# Patient Record
Sex: Male | Born: 1955 | Race: White | Hispanic: No | Marital: Married | Smoking: Never smoker
Health system: Southern US, Academic
[De-identification: ages and names within clinical notes are randomized; demographics above are authoritative.]

## PROBLEM LIST (undated history)

## (undated) DIAGNOSIS — J309 Allergic rhinitis, unspecified: Secondary | ICD-10-CM

## (undated) DIAGNOSIS — K219 Gastro-esophageal reflux disease without esophagitis: Secondary | ICD-10-CM

## (undated) DIAGNOSIS — Z9989 Dependence on other enabling machines and devices: Secondary | ICD-10-CM

## (undated) DIAGNOSIS — M1990 Unspecified osteoarthritis, unspecified site: Secondary | ICD-10-CM

## (undated) DIAGNOSIS — E785 Hyperlipidemia, unspecified: Secondary | ICD-10-CM

## (undated) DIAGNOSIS — G4730 Sleep apnea, unspecified: Secondary | ICD-10-CM

## (undated) DIAGNOSIS — C801 Malignant (primary) neoplasm, unspecified: Secondary | ICD-10-CM

## (undated) DIAGNOSIS — Z973 Presence of spectacles and contact lenses: Secondary | ICD-10-CM

## (undated) DIAGNOSIS — K5792 Diverticulitis of intestine, part unspecified, without perforation or abscess without bleeding: Secondary | ICD-10-CM

## (undated) DIAGNOSIS — I10 Essential (primary) hypertension: Secondary | ICD-10-CM

## (undated) DIAGNOSIS — H9190 Unspecified hearing loss, unspecified ear: Secondary | ICD-10-CM

## (undated) DIAGNOSIS — Z8669 Personal history of other diseases of the nervous system and sense organs: Secondary | ICD-10-CM

## (undated) HISTORY — PX: COLON SURGERY: SHX602

## (undated) HISTORY — PX: HX HERNIA REPAIR: SHX51

## (undated) HISTORY — PX: HX OTHER: 2100001105

## (undated) HISTORY — DX: Allergic rhinitis, unspecified: J30.9

## (undated) HISTORY — PX: HX BACK SURGERY: SHX140

## (undated) HISTORY — PX: COLONOSCOPY: SHX174

## (undated) HISTORY — DX: Unspecified hearing loss, unspecified ear: H91.90

## (undated) HISTORY — PX: HX HIP REPLACEMENT: SHX124

## (undated) SURGERY — Surgical Case
Anesthesia: *Unknown

---

## 2008-10-04 ENCOUNTER — Other Ambulatory Visit (HOSPITAL_COMMUNITY): Payer: Self-pay | Admitting: OTOLARYNGOLOGY

## 2014-05-23 HISTORY — PX: HX HERNIA REPAIR: SHX51

## 2020-05-23 HISTORY — PX: HX BACK SURGERY: SHX140

## 2020-07-06 IMAGING — CR XRAY LUMBAR SPINE MINIMUM 4 VIEWS
1 series · 5 of 5 positions shown · non-contrast
Comparison: None available.

﻿EXAM:  [id number] - X-RAY EXAM L-S SPINE MINIMUM 4 VIEWS
INDICATION: Chronic lower back pain.

[Series 1: view not recorded · 0.17mm/px · 5 of 5 slices shown]
[im 1/5]
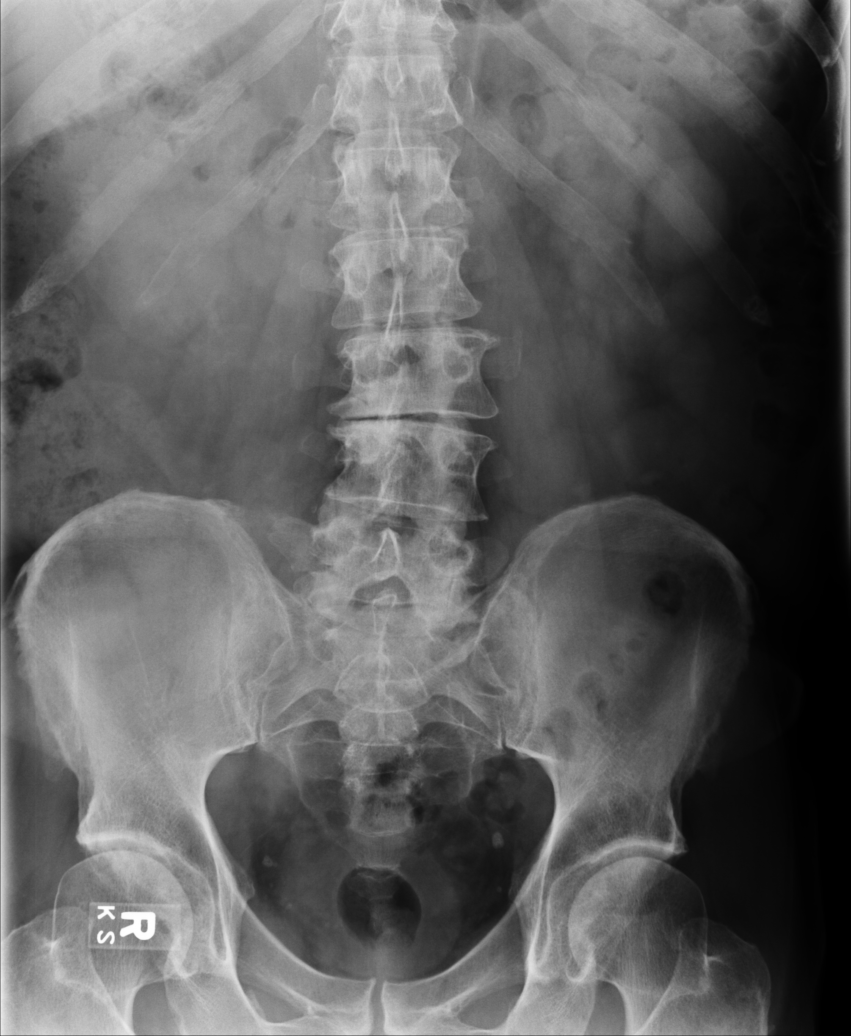
[im 2/5]
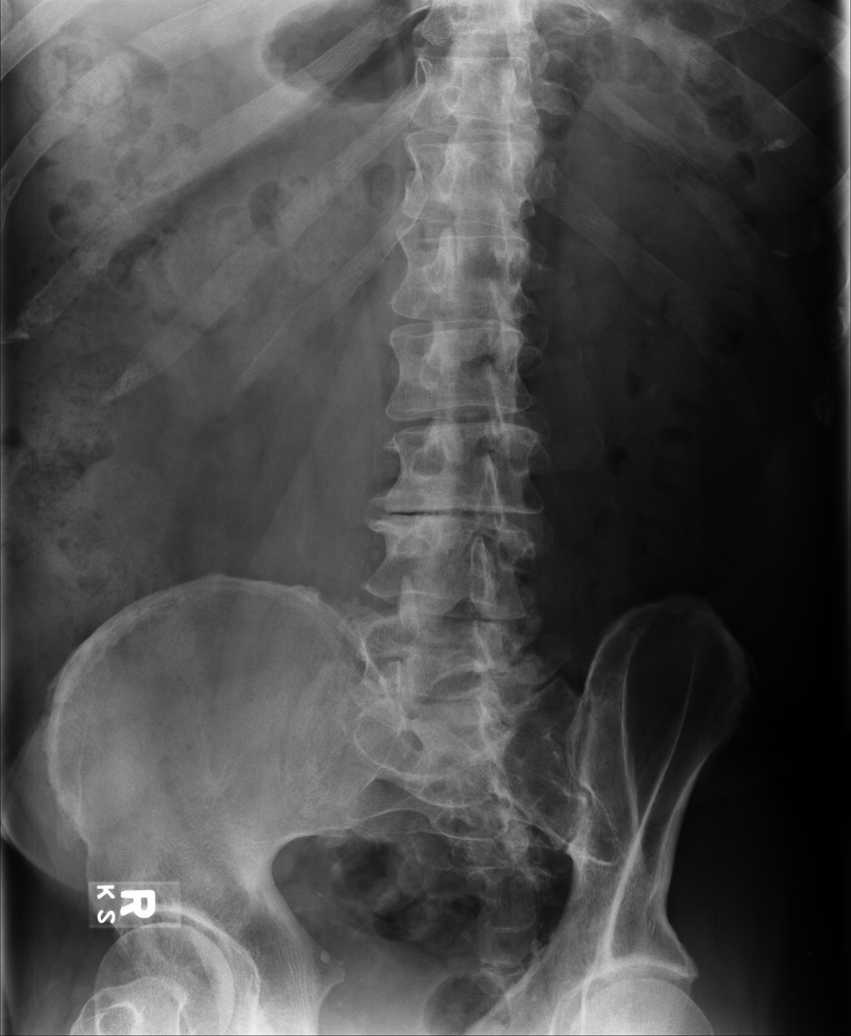
[im 3/5]
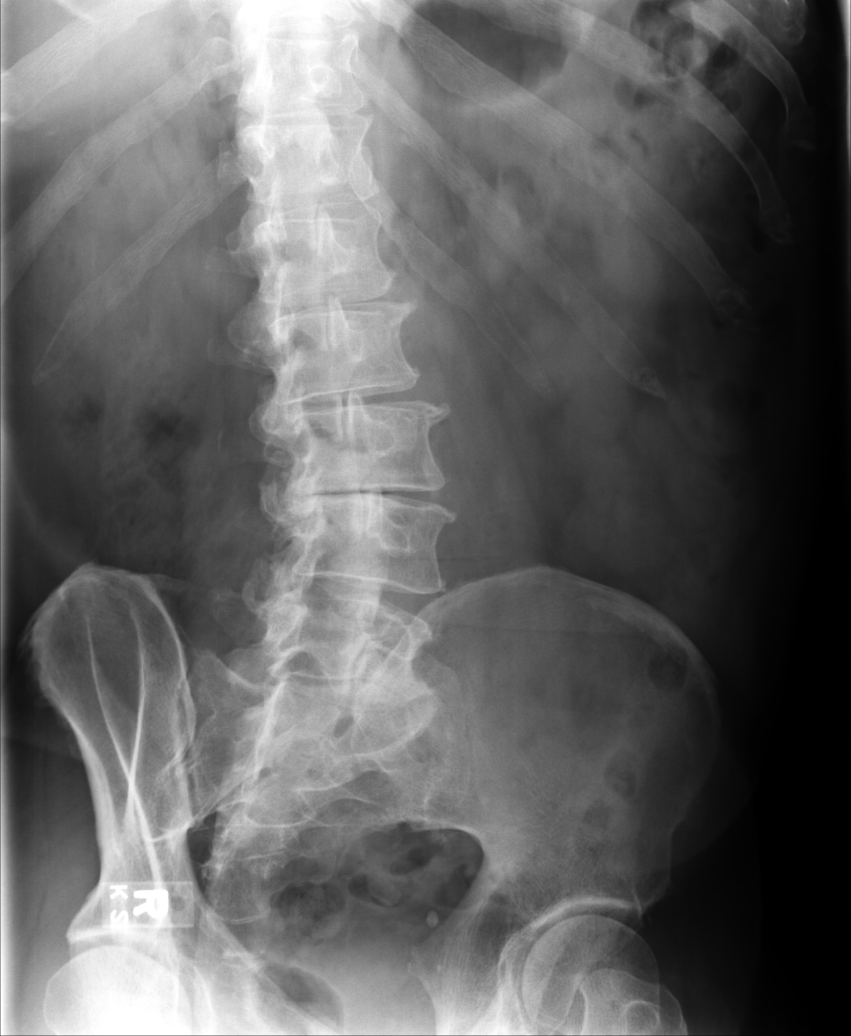
[im 4/5]
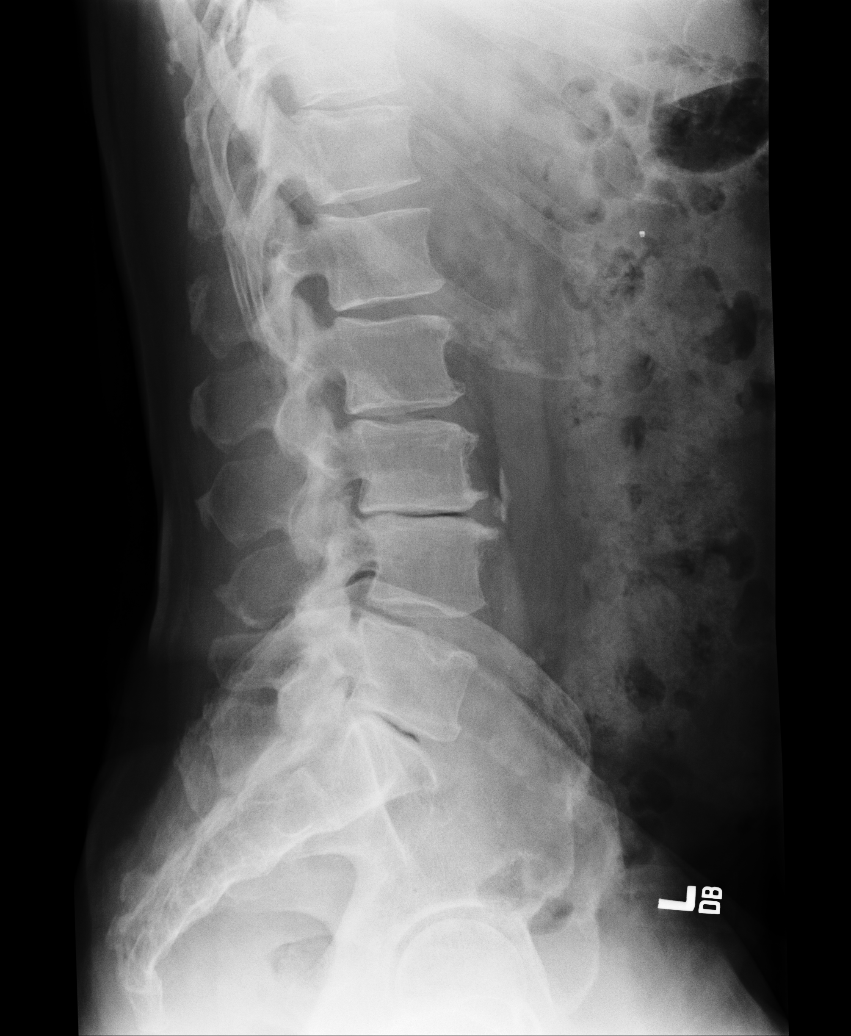
[im 5/5]
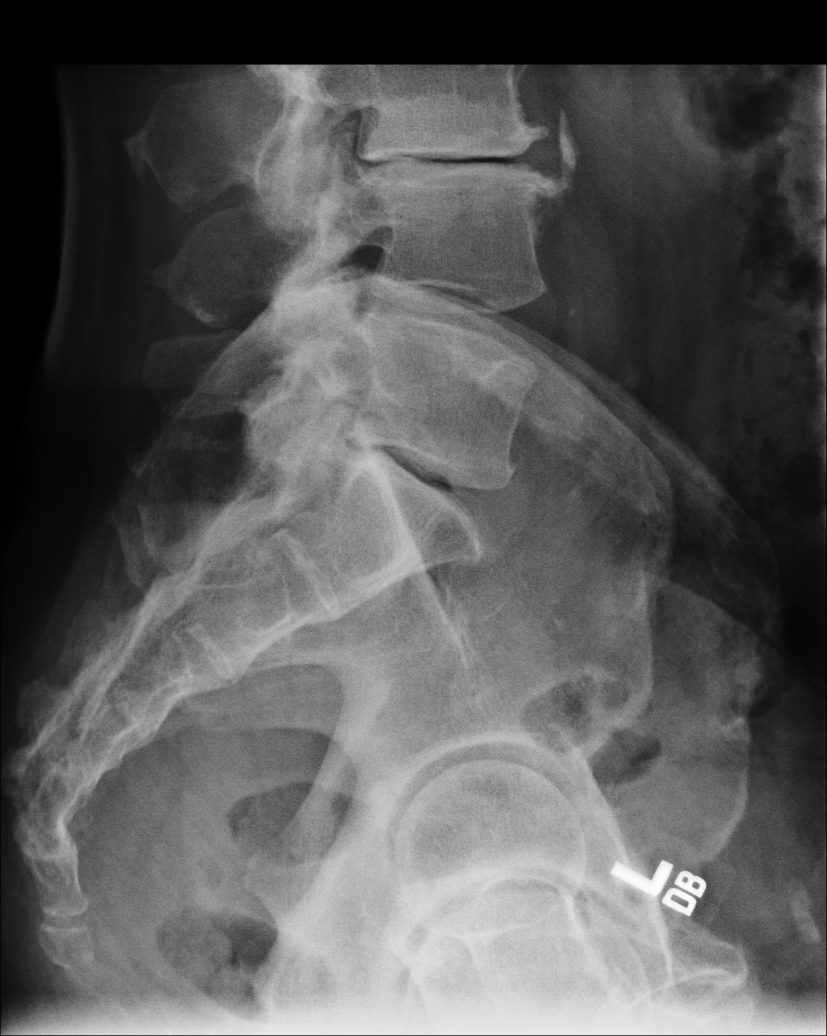

[5 of 5 positions shown; findings below may reference images not displayed]

FINDINGS: Vertebral bodies are normal in height and alignment. There is no acute fracture or subluxation. There is moderate to severe degenerative disc disease at L2-3, L3-4 and L5-S1 levels. There is also moderate facet arthropathy within the lower lumbar spine. Paraspinal soft tissues are unremarkable. There are scattered vascular calcifications.
IMPRESSION: Advanced multilevel arthritic changes as detailed above.

## 2020-07-20 IMAGING — MR MRI LUMBAR SPINE WITHOUT CONTRAST
5 of 7 series · 32 of 48 positions shown · IV contrast (gadolinium)
Comparison: None.

﻿EXAM:  [id number]   MRI LUMBAR SPINE WITHOUT CONTRAST
INDICATION: Worsening low back pain.
TECHNIQUE: Multiplanar, multisequential MRI of the lumbar spine was performed without gadolinium contrast.

[Series 5: T2 · sagittal · 4.0mm · 1.01mm/px · 5 of 13 slices shown (1 of 3)]
[im 1/13]
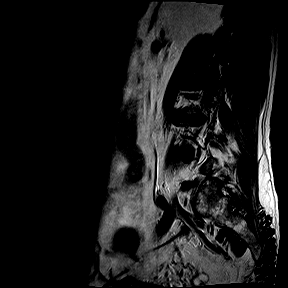
[im 4/13]
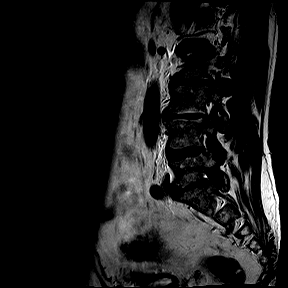
[im 7/13]
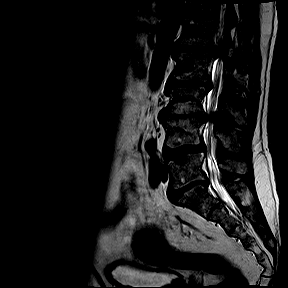
[im 10/13]
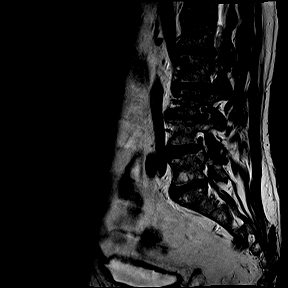
[im 13/13]
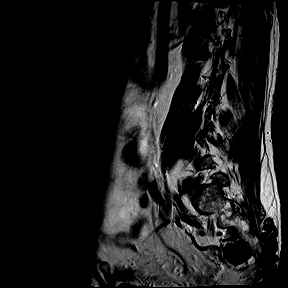

[Series 6: T1 · sagittal · 4.0mm · 1.01mm/px · 5 of 13 slices shown (1 of 2)]
[im 1/13]
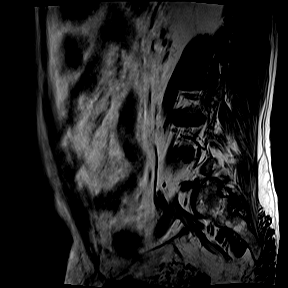
[im 4/13]
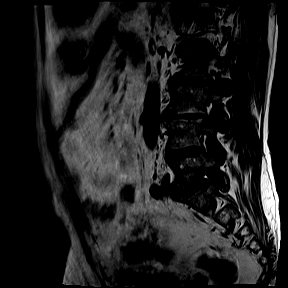
[im 7/13]
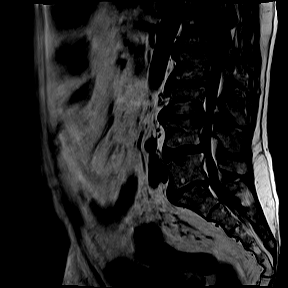
[im 10/13]
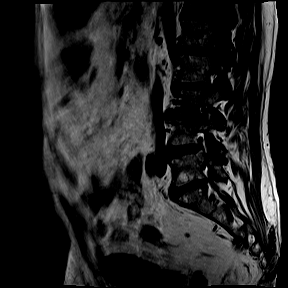
[im 13/13]
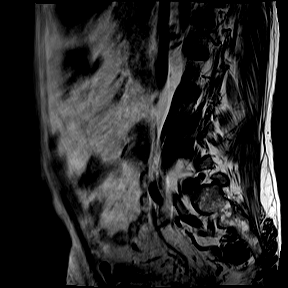

[Series 9: T2 · axial · 4.0mm · 0.52mm/px · z∈[-91,+109]mm · 9 of 23 slices shown (2 of 3)]
[im 1/23]
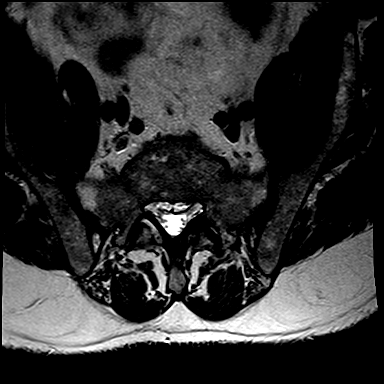
[im 3/23]
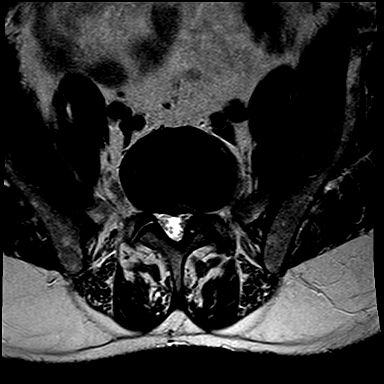
[im 6/23]
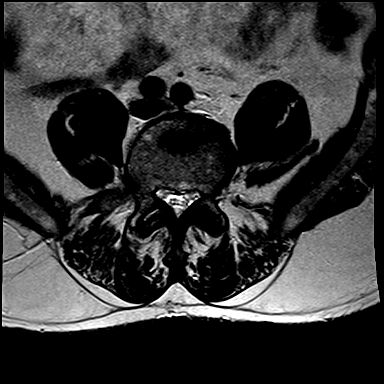
[im 9/23]
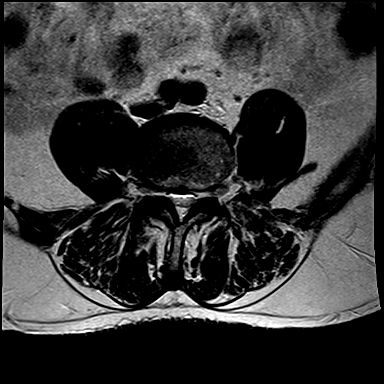
[im 12/23]
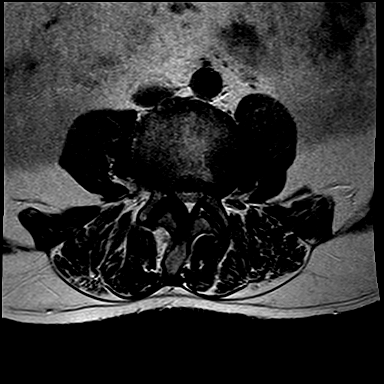
[im 14/23]
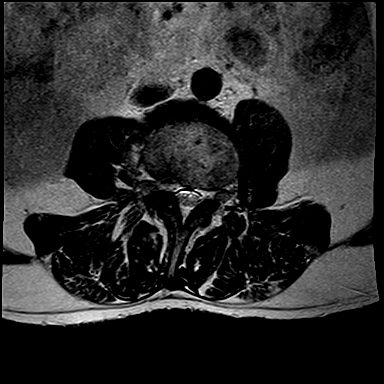
[im 17/23]
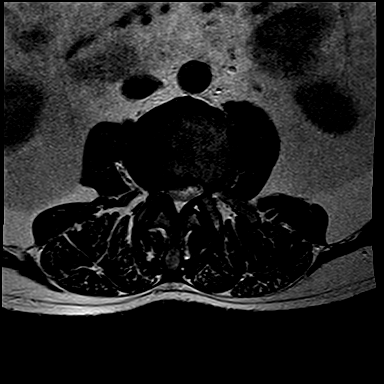
[im 20/23]
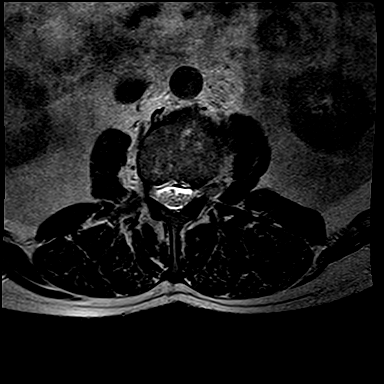
[im 23/23]
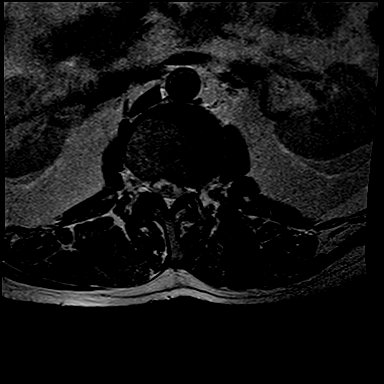

[Series 10: T1 · axial · 4.0mm · 0.52mm/px · z∈[-91,+62]mm · 6 of 23 slices shown (2 of 2)]
[im 1/23]
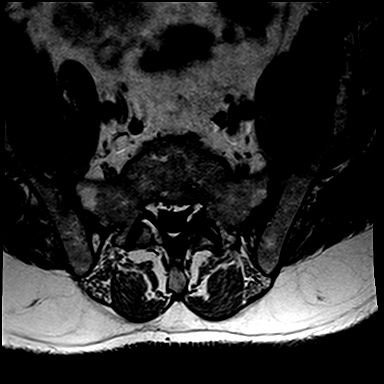
[im 3/23]
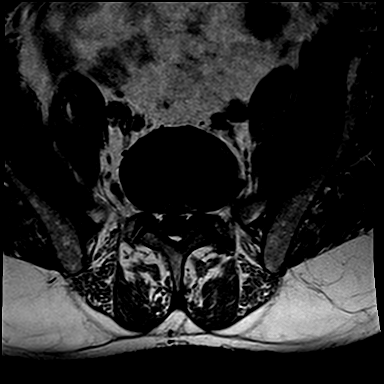
[im 6/23]
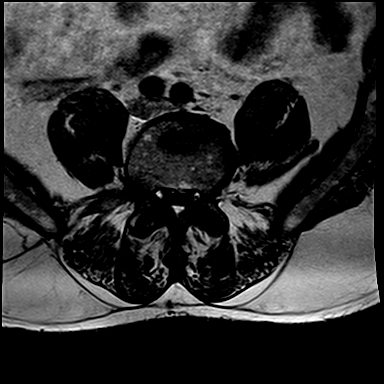
[im 9/23]
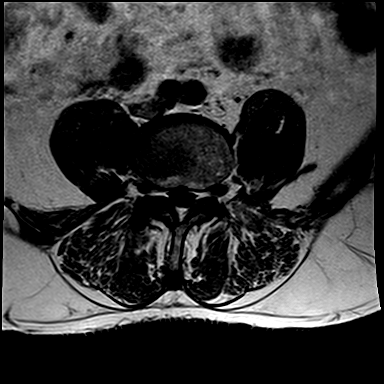
[im 14/23]
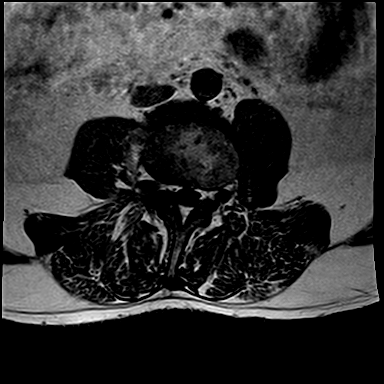
[im 17/23]
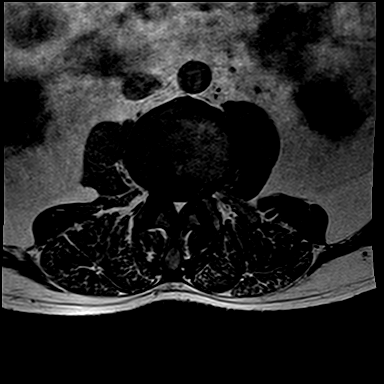

[Series 14: T2 · coronal · 5.0mm · 0.82mm/px · 7 of 18 slices shown (3 of 3)]
[im 1/18]
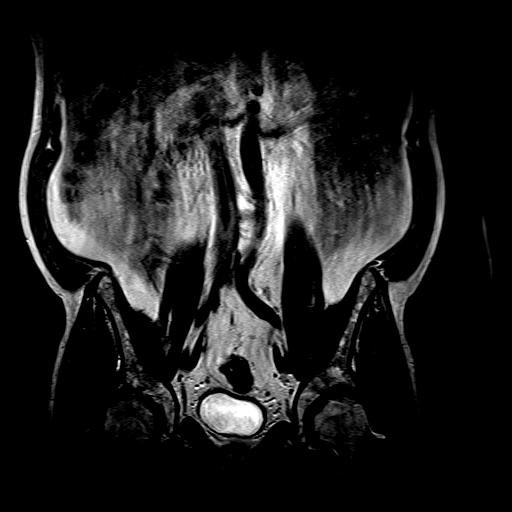
[im 3/18]
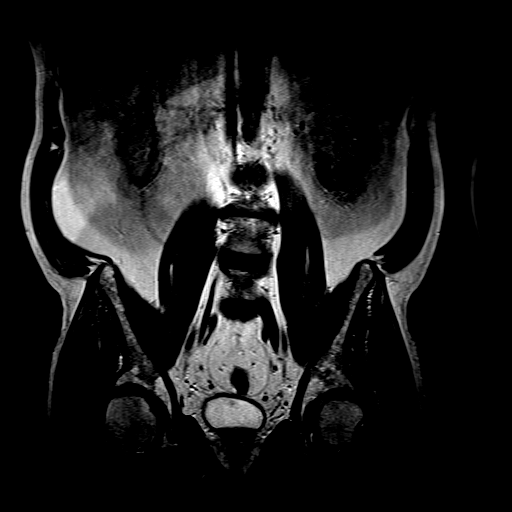
[im 6/18]
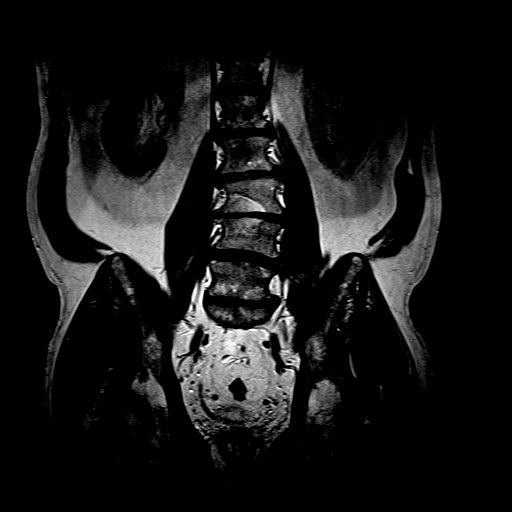
[im 9/18]
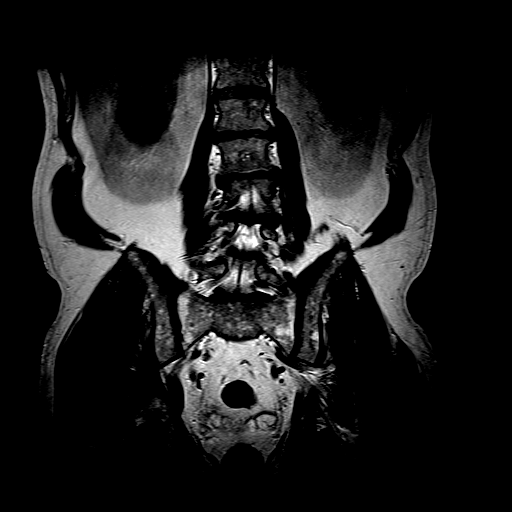
[im 12/18]
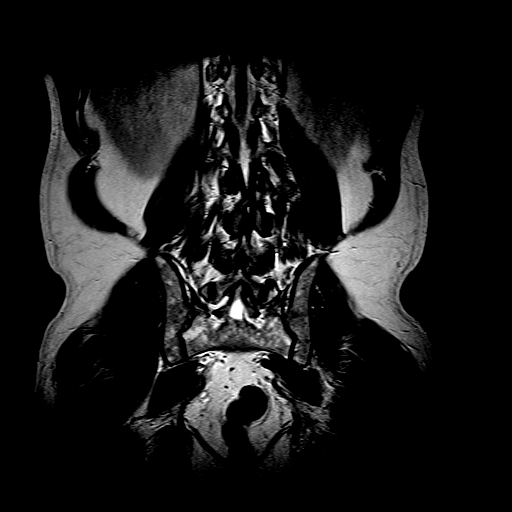
[im 15/18]
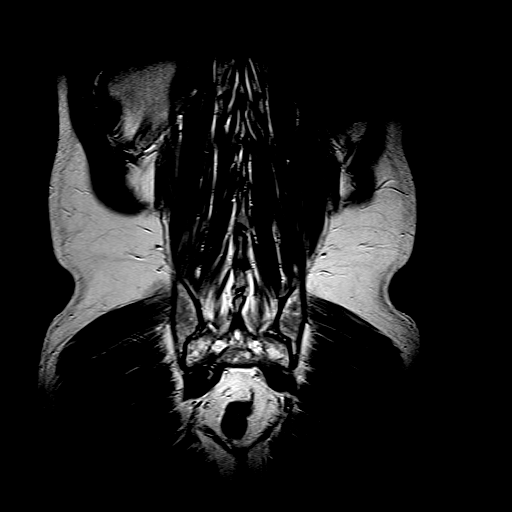
[im 18/18]
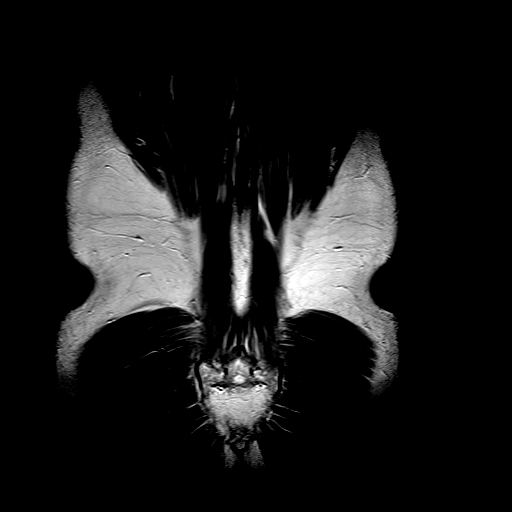

[32 of 48 positions shown; findings below may reference images not displayed]

FINDINGS: There is no acute bone marrow edema.  Conus terminates at L1-L2.  

At L1-L2, there is a significant left paracentral disc protrusion measuring 11 mm along with broad-based disc bulge, causing severe compromise of the central canal and left lateral recess.  

At L2-L3, broad-based disc bulge noted causing severe bilateral foraminal stenosis and moderate central canal narrowing.  

At L3-L4, broad-based disc osteophyte complex causing severe bilateral foraminal stenosis and moderate to severe central canal narrowing.  

At L4-L5, broad-based disc bulge causing severe bilateral foraminal and central canal stenosis.  

At L5-S1, disc osteophyte complex causing moderate bilateral foraminal narrowing.
IMPRESSION: 1. No acute bone marrow edema, compression fractures, or subluxation. 

2. Significant left paracentral sequestered disc fragment L1-L2 causing severe compromise at the left lateral recess and central canal.

3. Severe bilateral foraminal and central canal stenosis at L2-L3, L3-L4, and L4-L5 secondary to broad-based disc bulges.  

4. Moderate to severe bilateral foraminal stenosis at L5-S1.

## 2020-07-21 HISTORY — PX: HX HIP REPLACEMENT: SHX124

## 2021-02-10 IMAGING — MR MRI LUMBAR SPINE WITHOUT AND WITH CONTRAST
9 series · 48 of 48 positions shown · IV contrast (gadavist)
Comparison: MRI dated 07/20/2020 and radiographs dated 07/06/2020.

﻿EXAM:  [id number]   MRI LUMBAR SPINE WITHOUT AND WITH CONTRAST
INDICATION: New onset lower back pain. Surgery few months ago.
TECHNIQUE: Multiplanar multisequential MRI of the lumbar spine was performed without and with 5 mL of Gadavist.

[Series 5: T2 · sagittal · 4.0mm · 1.01mm/px · 3 of 13 slices shown (1 of 3)]
[im 1/13]
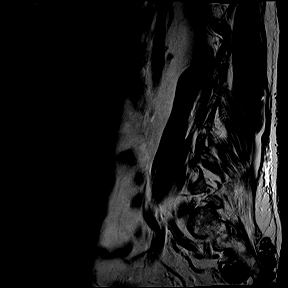
[im 7/13]
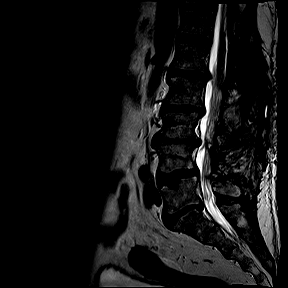
[im 13/13]
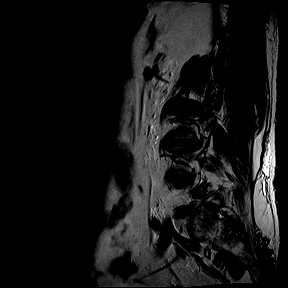

[Series 6: T1 · sagittal · 4.0mm · 1.01mm/px · 3 of 13 slices shown]
[im 1/13]
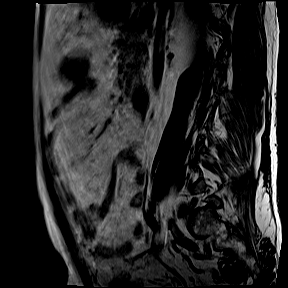
[im 7/13]
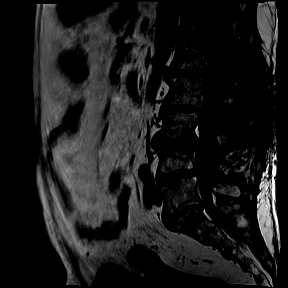
[im 13/13]
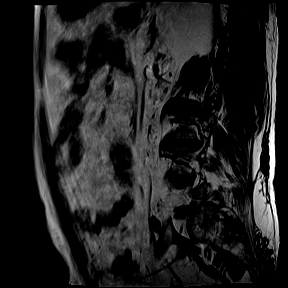

[Series 8: T1 fat-sat · sagittal · 4.0mm · 1.13mm/px · 4 of 13 slices shown (1 of 2)]
[im 1/13]
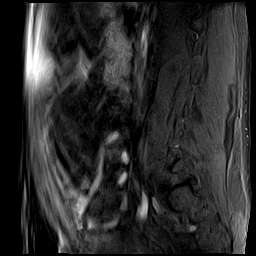
[im 5/13]
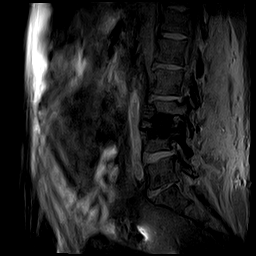
[im 9/13]
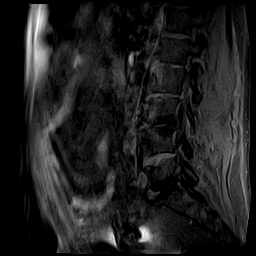
[im 13/13]
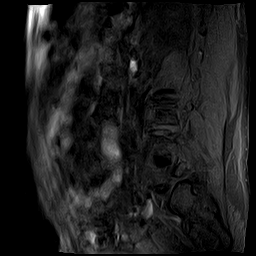

[Series 9: STIR · sagittal · 4.0mm · 1.13mm/px · 4 of 13 slices shown]
[im 1/13]
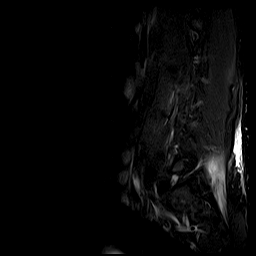
[im 5/13]
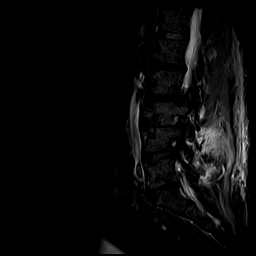
[im 9/13]
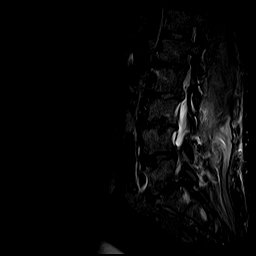
[im 13/13]
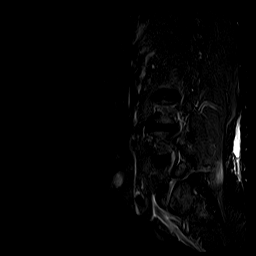

[Series 10: T2 · axial · 4.0mm · 0.47mm/px · z∈[-107,+86]mm · 8 of 25 slices shown (2 of 3)]
[im 1/25]
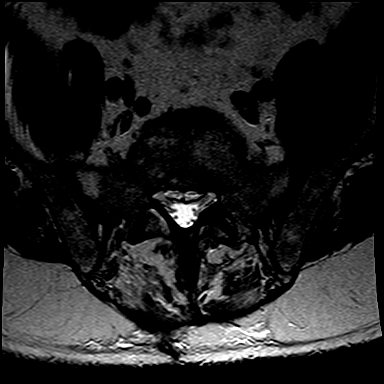
[im 4/25]
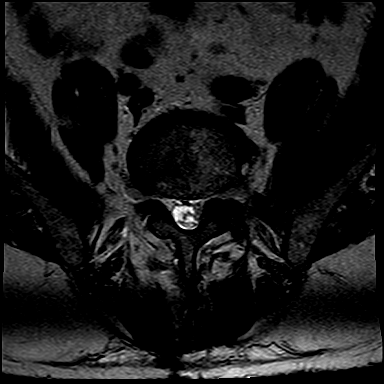
[im 7/25]
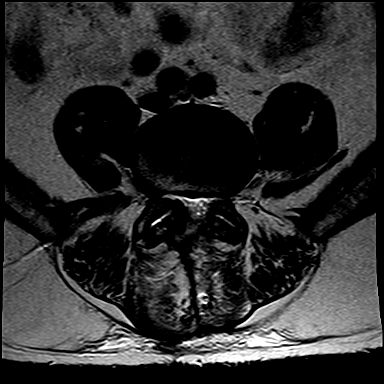
[im 11/25]
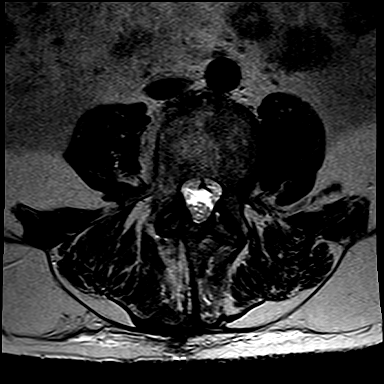
[im 14/25]
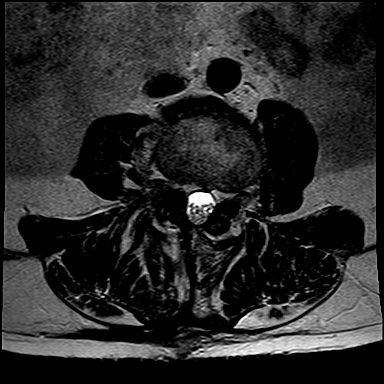
[im 18/25]
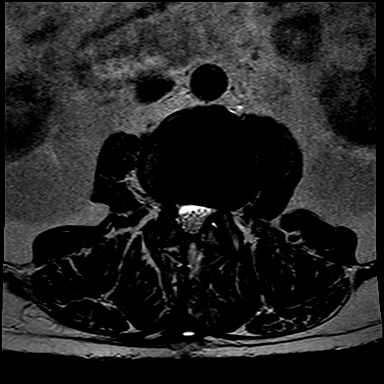
[im 21/25]
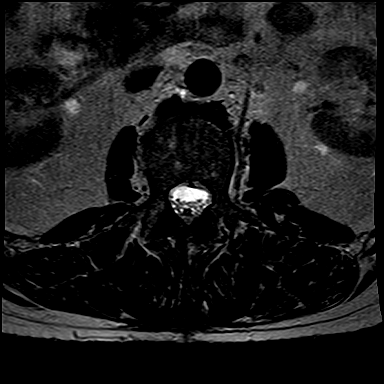
[im 25/25]
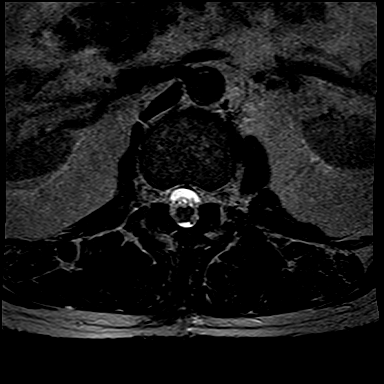

[Series 11: T1 fat-sat · axial · 4.0mm · 0.70mm/px · z∈[-107,+86]mm · 8 of 25 slices shown (2 of 2)]
[im 1/25]
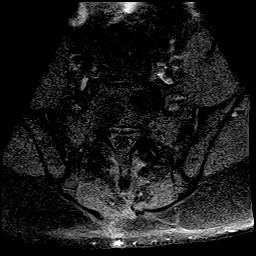
[im 4/25]
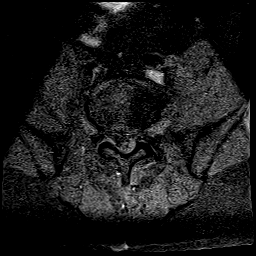
[im 7/25]
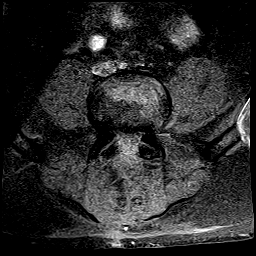
[im 11/25]
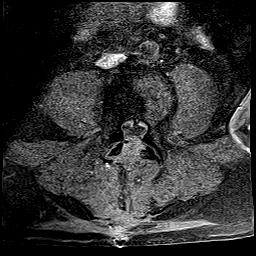
[im 14/25]
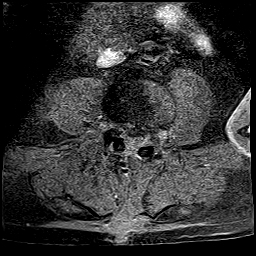
[im 18/25]
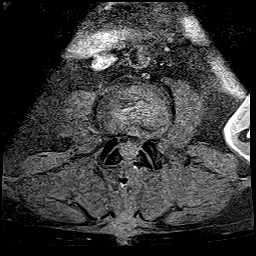
[im 21/25]
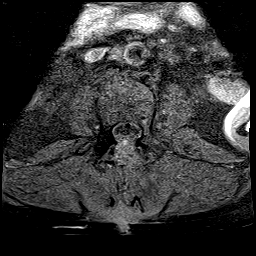
[im 25/25]
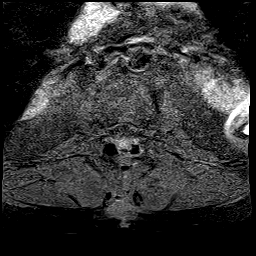

[Series 12: T1 fat-sat post-contrast · sagittal · 4.0mm · 1.13mm/px · 4 of 13 slices shown (1 of 2)]
[im 1/13]
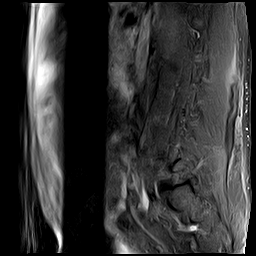
[im 5/13]
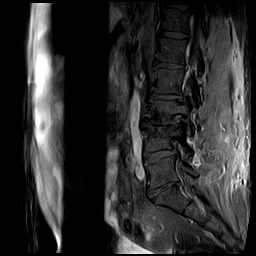
[im 9/13]
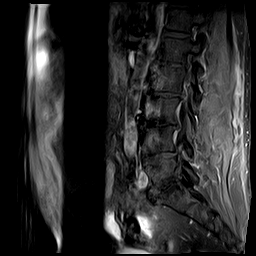
[im 13/13]
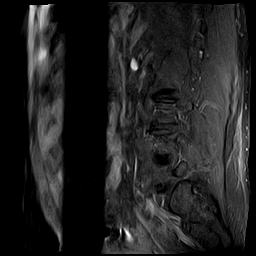

[Series 13: T1 fat-sat post-contrast · axial · 4.0mm · 0.70mm/px · z∈[-107,+86]mm · 8 of 25 slices shown (2 of 2)]
[im 1/25]
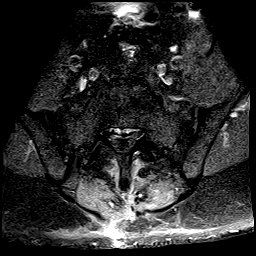
[im 4/25]
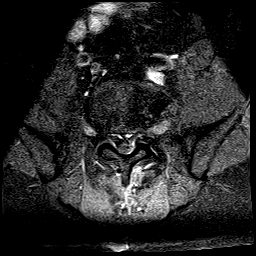
[im 7/25]
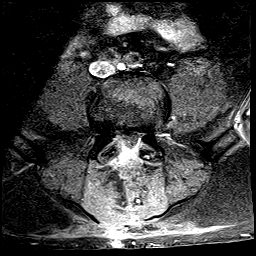
[im 11/25]
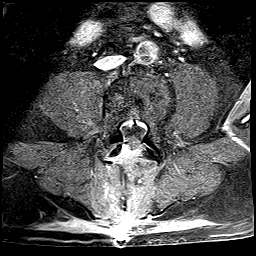
[im 14/25]
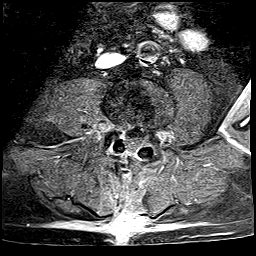
[im 18/25]
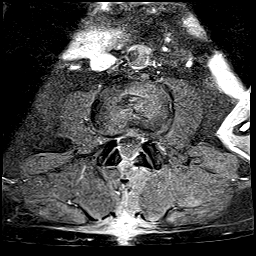
[im 21/25]
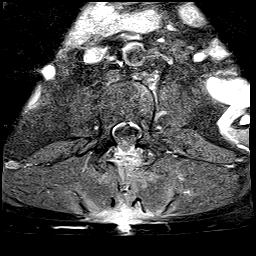
[im 25/25]
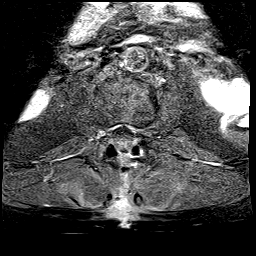

[Series 14: T2 · coronal · 6.0mm · 1.22mm/px · 6 of 20 slices shown (3 of 3)]
[im 1/20]
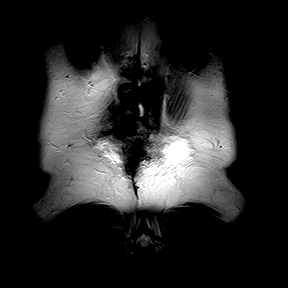
[im 4/20]
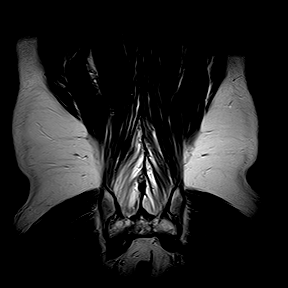
[im 8/20]
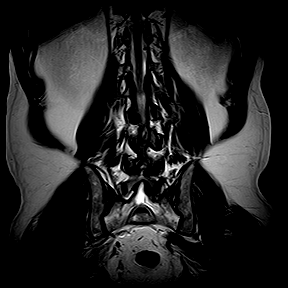
[im 12/20]
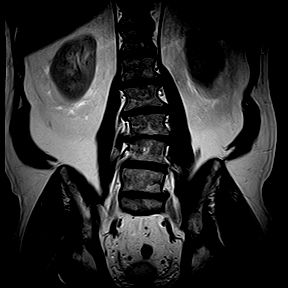
[im 16/20]
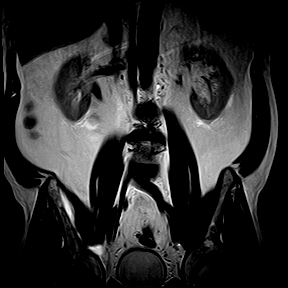
[im 20/20]
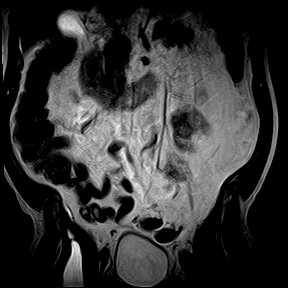

[48 of 48 positions shown; findings below may reference images not displayed]

FINDINGS: Bone marrow signal intensity is normal. There is no acute fracture or subluxation. Distal spinal cord is normal in signal intensity and terminates normally at L1-2 disc space level. Spinal canal is congenitally narrow.

At L1-2 level, there is a small to moderate broad-based central and left paracentral disc bulge mildly effacing the ventral thecal sac. There is a laminectomy defect at this level. There is mild bilateral neural foraminal stenosis from facet arthropathy and bulging annulus.

At L2-3 level, there is minimal retrolisthesis of L2 on L3 vertebral body. There is a small broad-based central disc bulge, mildly effacing the ventral thecal sac. A laminectomy defect is also seen at this level. There is moderate bilateral neural foraminal stenosis from facet arthropathy and bulging annulus.

At L3-4 level, there is marked disc desiccation. There is a small broad-based central disc bulge, mildly effacing the ventral thecal sac. A laminectomy defect is also seen at this level. There is moderate to severe right and mild-to-moderate left neural foraminal stenosis from facet arthropathy and bulging annulus.

At L4-5 level, there is a small broad-based central disc bulge, mildly effacing the ventral thecal sac. A laminectomy defect is also seen at this level. There is moderate bilateral neural foraminal stenosis from facet arthropathy.

At L5-S1 level, there is a small broad-based central disc bulge, minimally abutting the ventral thecal sac. There is moderate to severe left and moderate right neural foraminal stenosis from facet arthropathy and bulging annulus.

Paraspinal soft tissues are unremarkable. Following intravenous contrast administration, there is no abnormal nerve root or paraspinal soft tissue enhancement.
IMPRESSION: 1. L1 through L4 laminectomy defects. 

2. Small disc bulges at all levels without significant spinal stenosis at any level. 

3. Multilevel neural foraminal stenosis as detailed above.

## 2021-03-16 NOTE — Progress Notes (Signed)
[facility]  [address]  [place], [place] [address]  Phone: [phone]  Fax: [phone]    PATIENT NAME: Travis Brooks   DATE:  03/16/2021  DOB: Feb 24, 1956     PCP: No primary care provider on file.    CHIEF COMPLAINT:  The patient is seen at the request of Dr. Marella Chimes for Lumbar pain    HPI:     Today I had the pleasure of seeing Travis Brooks at [facility].  Thank you for referring this pleasant patient.  The history is based upon the patient's recollection of events.  As you know, the patient is a 65 y.o.  male presenting with low back  pain. Pain started 2 months ago, with a gradual onset and since then symptoms have been getting worse.      The patients pain is located in the low back and does radiate down into his right hip, he reports the majority of pain is in his anterior hip and thigh, it radiates down his leg into the top of his foot at times..  The pain is described as aching, burning, throbbing, cold, gnawing, nagging, penetrating, sharp, pins/needles, shooting, sore, stabbing, tightness, stinging. There is associated tingling. There is weakness reported. The symptoms are worse with walking, sitting, lying down and some relief with ice pack, heating pad, Tramadol, Meloxicam, Tylenol, he reports it knocks some of the pain out but it does not take it completly away. There is no bowel/bladder incontinence. They have tried PT and back surgery.       Physical Therapy: [facility] after surgery, it helped to some degree, he continues those exercises   at home now on a daily bases.      Pain Intervential Procedure:  none    Prior Pain Clinic: No    PAST MEDICAL HISTORY:  Past Medical History:   Diagnosis Date   . Anxiety state    . Constipation    . Essential hypertension    . Polyarticular juvenile rheumatoid arthritis, chronic (HCC)    . Sleep apnea           PAST SURGICAL HISTORY:No past surgical history on file.   Back Surgery-  Discectomy At L1/L2 level April of 2022, yes, all of this started afterwords    CURRENT MEDICATIONS:   Current Outpatient Medications   Medication Sig Dispense Refill   . amLODIPine (NORVASC) 5 mg Tablet 1 tablet every day .     . magnesium oxide 200 mg magnesium Tablet 2 tablets .     . metoprolol succinate (TOPROL-XL) 50 mg Tablet Sustained Release 24HR 1 tablet every day .     . potassium chloride SA (K-DUR) 20 mEq Tab Sust.Rel. Particle/Crystal TAKE 1 TABLET ONCE A DAY  WITH FOOD     . aspirin EC 81 mg Tablet, Delayed Release (E.C.) take 81 mg every day by mouth .       No current facility-administered medications for this visit.          ALLERGIES: No Known Allergies       SOCIAL HISTORY:  Social History     Socioeconomic History   . Marital status: Married     Spouse name: Not on file   . Number of children: Not on file   . Years of education: Not on file   . Highest education level: Not on file   Occupational History   . Not on file   Tobacco  seen at this level. There is moderate bilateral neural foraminal stenosis from facet arthropathy.  Al L5-51 level, there is a small broad-based central disc bulge, minimally abutting the ventral thecal sac. There is moderate to severe left and moderate right neural foraminal stenosis from facet arthropathy and bulging annulus.  Paraspinal soft tissues are unremarkable. Following intravenous contrast administration, there is no abnormal nerve root or paraspinal soft tissue enhancement.     ASSESSMENT:  Post laminectomy Pain syndrome  Lumbar radiculopathy  Lumbar spinal stenosis    RECOMMENDATIONS/DECISION MAKING: Kolyn has a lumbar radiculopathy after having surgery earlier this year. His pain is along the L3 deramatome --- Will proceed with a right L3 TFESI as a dx and tx injection. F/u with Dr. Marella Chimes post procedure.    Tobacco abuse: nonsmoker    Lucila Maine, MD    CC:  PCP: No primary care provider on file.               Referring: No ref. provider found     PATIENT NAME:  Travis Brooks  DATE:  03/16/2021  DOB/AGE:   [birth year]  PREOP DX:  Lumbar radiculopathy with degenerative disc disease.  POSTOP DX:  Same   PROCEDURE: Right L3 Transforaminal Epidural Steroid Injection  SURGEON:  Lucila Maine, MD      Anesthesia: Local infiltration with 1% Lidocaine.     Indications: Today I had the pleasure of seeing  Travis Brooks at our pain clinic. The patient denies a current infection and use of blood thinners. The patient denies any new motor or sensory deficits.      I explained the procedure to the patient including the risks, benefits, and alternatives to the procedure.  The patient verbalized understanding and was willing to proceed.    Procedure in detail:   An informed consent was obtained.  The patient was taken to the procedure room and positioned prone on the procedure table.  Vital signs were monitored with an automatic blood pressure cuff, pulse ox, and they remained stable throughout the entire procedure.  The skin was prepped and draped in the standard sterile fashion with Betadine x 3.  Skin and subcutaneous tissues were infiltrated with 1% Lidocaine using a 27 gauge 1 1/4 inch needle.  A fluoroscopic view of the neuroforamin was obtained by positioning the image intensifier in the oblique slight cephalad view. A 22g 5 inch spinal needle with a curved tip was advanced under fluoroscopic guidance under the pedicle.   The needle's position was verified in both AP and Lateral views. Additionally, the position of the needle was verified by injecting radiopaque dye, Iohexol 180 mg/ml, which showed excellent spread of dye along the nerve root and some spread into the epidural space. After negative aspiration for heme or CSF, 40mg  preservative free Triamcinolone diluted in 1 ml of preservative free 1% Lidocaine was injected onto the nerve root.  The needle was withdrawn.  The patient tolerated the procedure well with no complications.     Note:  The procedure was performed under fluoroscopic guidance AP and lateral views were obtained and evaluated.    Pre-procedure Pain: 7/10  Post-procedure Pain: 3/10  [facility]  [address]  [place], [place] [address]  Phone: [phone]  Fax: [phone]    PATIENT NAME: Travis Brooks   DATE:  03/16/2021  DOB: Feb 24, 1956     PCP: No primary care provider on file.    CHIEF COMPLAINT:  The patient is seen at the request of Dr. Marella Chimes for Lumbar pain    HPI:     Today I had the pleasure of seeing Travis Brooks at [facility].  Thank you for referring this pleasant patient.  The history is based upon the patient's recollection of events.  As you know, the patient is a 65 y.o.  male presenting with low back  pain. Pain started 2 months ago, with a gradual onset and since then symptoms have been getting worse.      The patients pain is located in the low back and does radiate down into his right hip, he reports the majority of pain is in his anterior hip and thigh, it radiates down his leg into the top of his foot at times..  The pain is described as aching, burning, throbbing, cold, gnawing, nagging, penetrating, sharp, pins/needles, shooting, sore, stabbing, tightness, stinging. There is associated tingling. There is weakness reported. The symptoms are worse with walking, sitting, lying down and some relief with ice pack, heating pad, Tramadol, Meloxicam, Tylenol, he reports it knocks some of the pain out but it does not take it completly away. There is no bowel/bladder incontinence. They have tried PT and back surgery.       Physical Therapy: [facility] after surgery, it helped to some degree, he continues those exercises   at home now on a daily bases.      Pain Intervential Procedure:  none    Prior Pain Clinic: No    PAST MEDICAL HISTORY:  Past Medical History:   Diagnosis Date   . Anxiety state    . Constipation    . Essential hypertension    . Polyarticular juvenile rheumatoid arthritis, chronic (HCC)    . Sleep apnea           PAST SURGICAL HISTORY:No past surgical history on file.   Back Surgery-  Discectomy At L1/L2 level April of 2022, yes, all of this started afterwords    CURRENT MEDICATIONS:   Current Outpatient Medications   Medication Sig Dispense Refill   . amLODIPine (NORVASC) 5 mg Tablet 1 tablet every day .     . magnesium oxide 200 mg magnesium Tablet 2 tablets .     . metoprolol succinate (TOPROL-XL) 50 mg Tablet Sustained Release 24HR 1 tablet every day .     . potassium chloride SA (K-DUR) 20 mEq Tab Sust.Rel. Particle/Crystal TAKE 1 TABLET ONCE A DAY  WITH FOOD     . aspirin EC 81 mg Tablet, Delayed Release (E.C.) take 81 mg every day by mouth .       No current facility-administered medications for this visit.          ALLERGIES: No Known Allergies       SOCIAL HISTORY:  Social History     Socioeconomic History   . Marital status: Married     Spouse name: Not on file   . Number of children: Not on file   . Years of education: Not on file   . Highest education level: Not on file   Occupational History   . Not on file   Tobacco

## 2021-04-26 IMAGING — MR MRI HIP RT W/O CONTRAST
5 of 7 series · 25 of 40 positions shown · non-contrast
Comparison: No prior imaging studies of the right hip are available for comparison.

﻿EXAM:  [id number]   MRI HIP RT W/O CONTRAST
INDICATION: 65-year-old with worsening right hip pain.  No history of trauma.  Previous history of lumbar spine surgery.  No history of malignancy.
TECHNIQUE: Axial, coronal and sagittal images including T1, fat suppressed inversion recovery and T2 sequences.

[Series 10: T1 · axial · right · 6.0mm · 0.78mm/px · z∈[-100,+88]mm · 7 of 30 slices shown (1 of 3)]
[im 1/30]
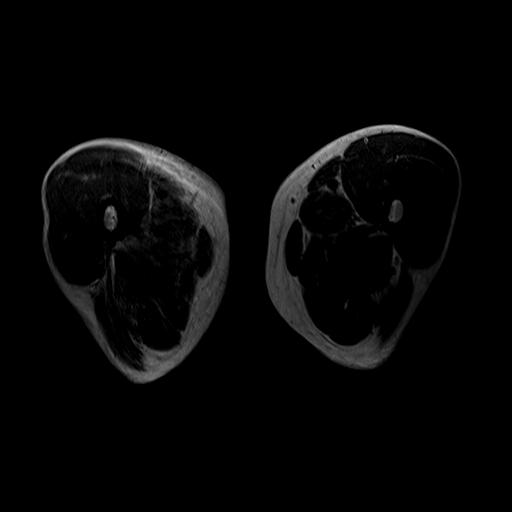
[im 5/30]
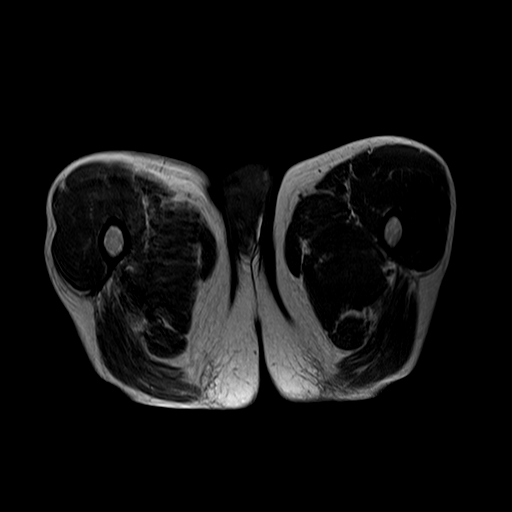
[im 10/30]
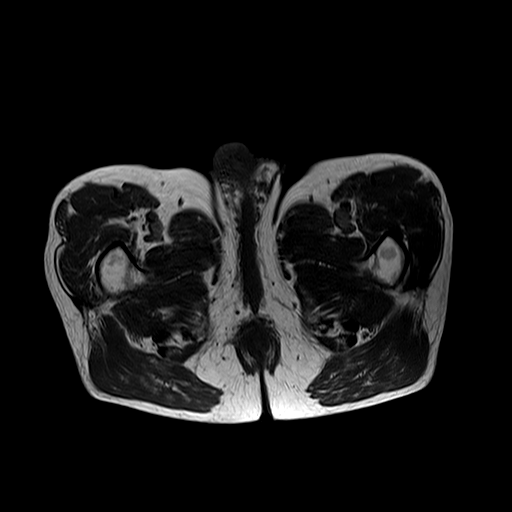
[im 15/30]
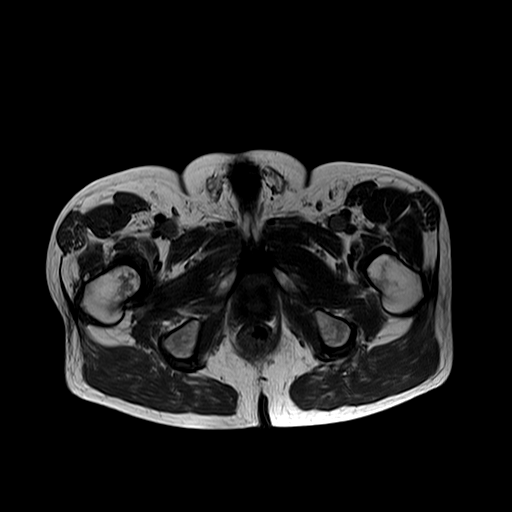
[im 20/30]
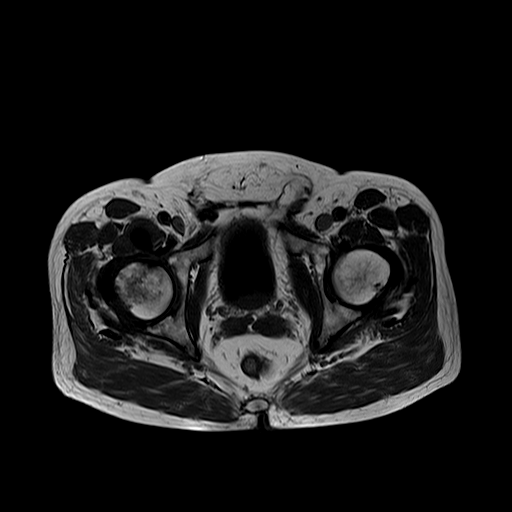
[im 25/30]
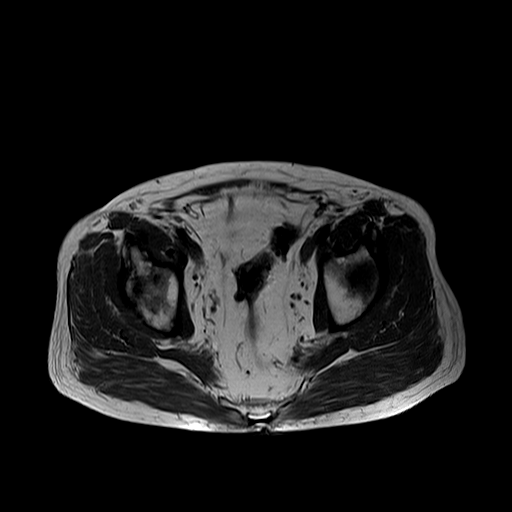
[im 30/30]
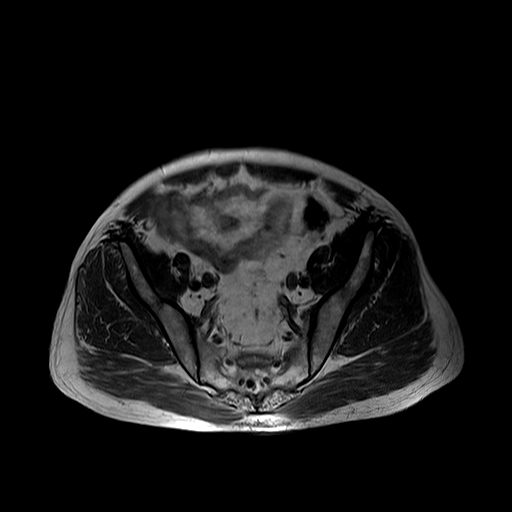

[Series 12: T1 · sagittal · right · 6.0mm · 0.62mm/px · 5 of 20 slices shown (2 of 3)]
[im 1/20]
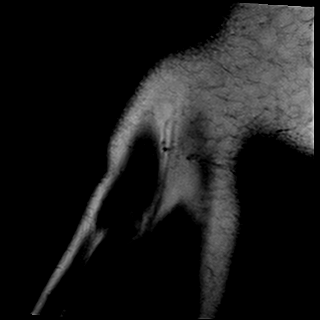
[im 5/20]
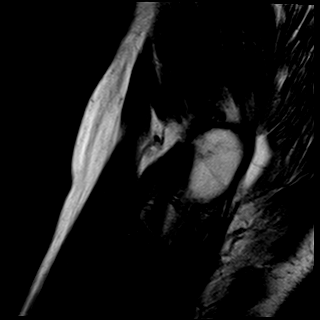
[im 10/20]
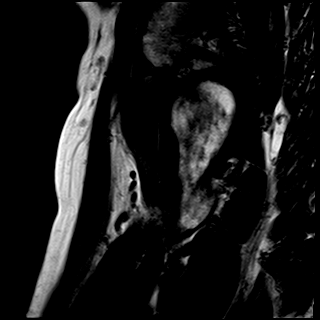
[im 15/20]
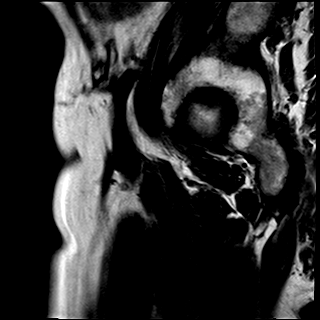
[im 20/20]
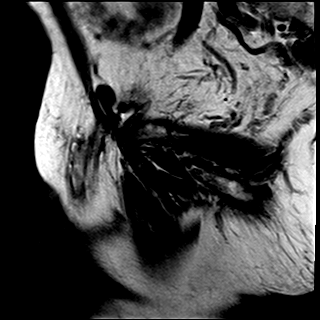

[Series 13: T2 fat-sat · sagittal · right · 6.0mm · 0.78mm/px · 5 of 20 slices shown (1 of 2)]
[im 1/20]
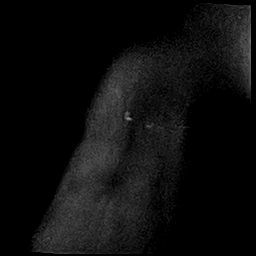
[im 5/20]
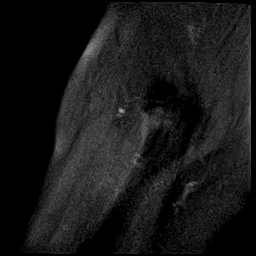
[im 10/20]
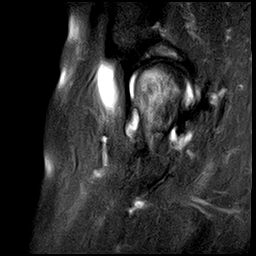
[im 15/20]
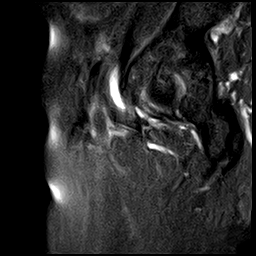
[im 20/20]
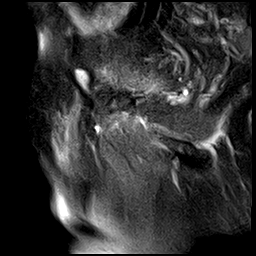

[Series 15: T2 fat-sat · coronal · right · 6.0mm · 0.70mm/px · 5 of 20 slices shown (2 of 2)]
[im 1/20]
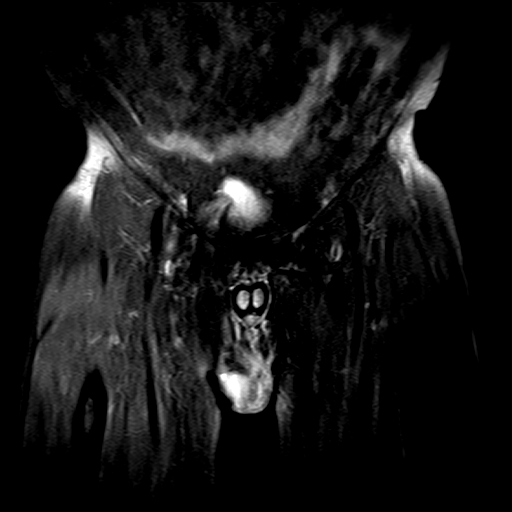
[im 5/20]
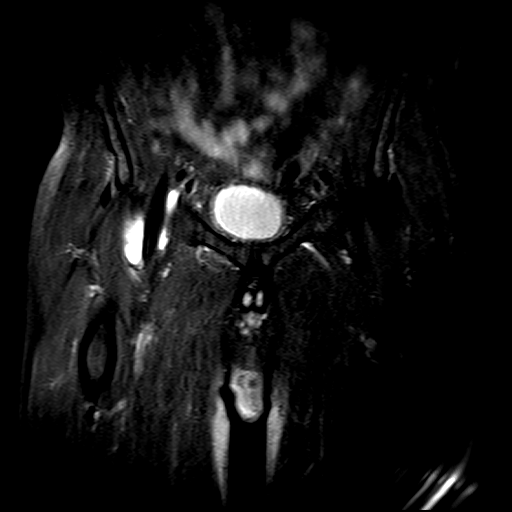
[im 10/20]
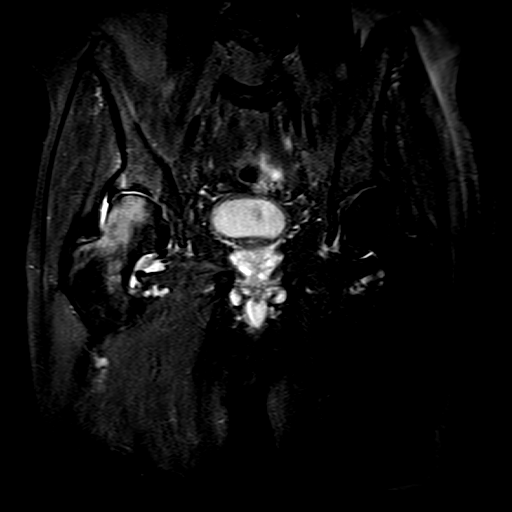
[im 15/20]
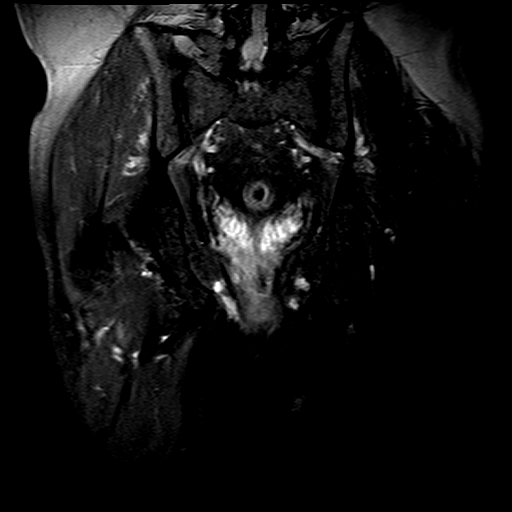
[im 20/20]
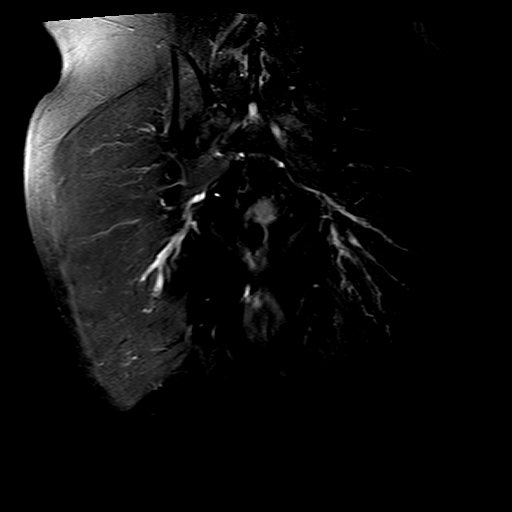

[Series 16: T1 · coronal · right · 6.0mm · 0.70mm/px · 3 of 20 slices shown (3 of 3)]
[im 1/20]
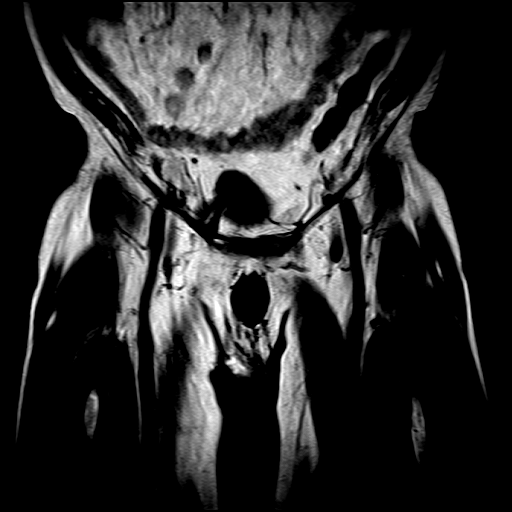
[im 5/20]
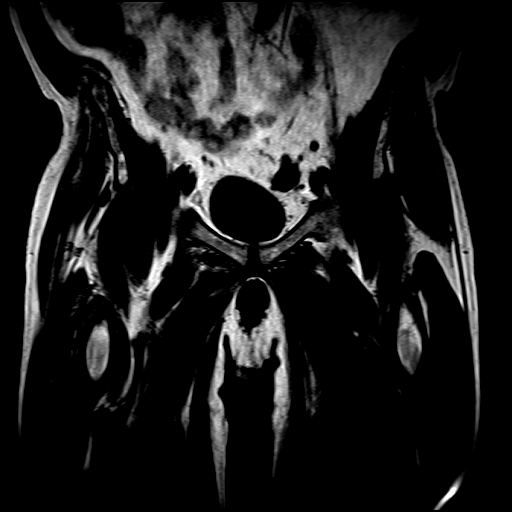
[im 10/20]
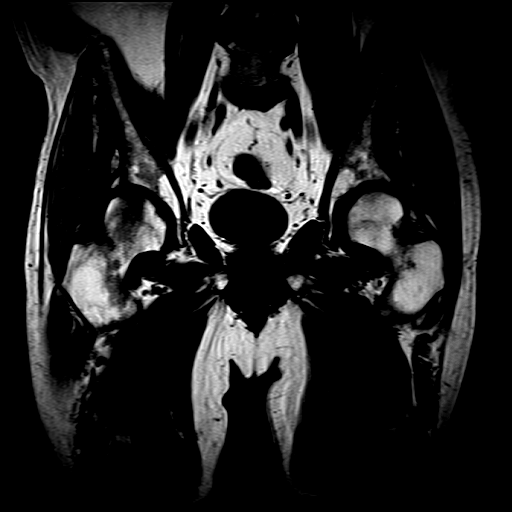

[25 of 40 positions shown; findings below may reference images not displayed]

Findings of the right hip are compared with the findings of the left hip on the same study.
FINDINGS: A significant portion of the subarticular aspect of femoral head extending to the neck of the right femur at the right hip shows focal bone marrow edema.  No MRI evidence of avascular necrosis of the femoral head is seen.  No definite fracture lines are visible.  There is no evidence of labral tear at the right hip.  Good sized effusion is noted in the right hip joint with a ganglion cyst extending from the hip joint to the right groin area anteriorly measuring 6 x 1.9 cm in sagittal dimensions.  Mild arthritic changes of the left hip joint.  Soft tissues are unremarkable at the hip.
IMPRESSION: 1. Significant area of bone marrow edema of the superior subarticular aspect of the femoral head extending to the superior aspect of femoral neck at the right hip.  

2. Effusion in the hip joint with ganglion cyst extension from the effusion anteriorly in the right groin as mentioned above measuring 6 x 1.8 cm in sagittal dimensions.  

3. Above mentioned findings are probably result of transient bone marrow edema syndrome of the femoral head with synovitis.  Follow-up evaluation by repeat examination is recommended in 3-4 months.  

4. Degenerative arthritic changes of the hip joint.  No MRI evidence of avascular necrosis on this study.

## 2021-07-23 ENCOUNTER — Other Ambulatory Visit: Payer: Self-pay

## 2021-07-27 ENCOUNTER — Other Ambulatory Visit (HOSPITAL_COMMUNITY): Payer: Medicare PPO

## 2021-07-27 ENCOUNTER — Other Ambulatory Visit: Payer: Self-pay

## 2021-08-09 ENCOUNTER — Other Ambulatory Visit: Payer: Self-pay

## 2021-08-09 ENCOUNTER — Other Ambulatory Visit: Payer: Medicare PPO | Attending: Orthopaedic Surgery

## 2021-08-09 DIAGNOSIS — Z01812 Encounter for preprocedural laboratory examination: Secondary | ICD-10-CM | POA: Insufficient documentation

## 2021-08-09 DIAGNOSIS — Z01818 Encounter for other preprocedural examination: Secondary | ICD-10-CM

## 2021-08-09 DIAGNOSIS — Z20822 Contact with and (suspected) exposure to covid-19: Secondary | ICD-10-CM | POA: Insufficient documentation

## 2021-08-09 LAB — COVID-19 ~~LOC~~ MOLECULAR LAB TESTING
INFLUENZA VIRUS TYPE A: NOT DETECTED
INFLUENZA VIRUS TYPE B: NOT DETECTED
RESPIRATORY SYNCTIAL VIRUS (RSV): NOT DETECTED
SARS-CoV-2: NOT DETECTED

## 2021-08-09 LAB — HGA1C (HEMOGLOBIN A1C WITH EST AVG GLUCOSE): HEMOGLOBIN A1C: 4.8 % (ref 4.0–6.0)

## 2021-08-12 DIAGNOSIS — E7800 Pure hypercholesterolemia, unspecified: Secondary | ICD-10-CM

## 2021-08-12 DIAGNOSIS — I10 Essential (primary) hypertension: Secondary | ICD-10-CM

## 2021-08-12 DIAGNOSIS — Z96641 Presence of right artificial hip joint: Principal | ICD-10-CM

## 2021-08-12 NOTE — H&P (Signed)
Travis Brooks  [id number]      CHIEF COMPLAINT  Right Hip and Knee pain; 04/29/21; HG/PJB    HISTORY  OA right hip: 66 year old male presents with right hip pain X several years. Patient states pain is in the groin radiating down the thigh activity related with walking, initiating gait, standing present position, significant nig httime pain. Denies paresthesia or injury. Conservative care includes activity modification, ice, heat, NSAIDs, physical therapy, use of assistive devices at home exercises with no relief in symptoms. Here for further evaluation. MRI reveals severe OA. Patient states interfering with his daily activities and quality of life.    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, Treatment: By: Cardiac: Hypertension and Hyperlipidemia Musculoskeletal: Osteoarthritis   Denies RA, Psoriatic, Ankylosing spondylitis, Gout, Fibromyalgia, Osteoporosis and [place] Rheumatologic: Denies N/A Pulmonary: Sleep Apnea Sleep apnea treatment: CPAP    PAST SURGICAL HISTORY  Date Side/ Site Procedure Details Doctor Facility Complications 09/17/20 back surgery none 04/22/16 Colon resection none 01/14/16 Herniorrhaphy    ALLERGIES  No allergies    MEDICATIONS  Name Medrol (Pak) 4 mg 1 Oral per pak direction 2021-04-22 ------Glucocorticoids ------INTERACTIONS: Drug-drug interaction to Ecotrin Low Strength; Lopressor 50 mg 1 oral qd 2021-04-22 ------Beta Blockers Cardiac Selective Norvasc 5 mg 1 oral qd 2021-04-22 ------Calcium Channel Blockers - Dihydropyridines Crestor 20 mg 1 oral qd 2021-04-22 ------Antihyperlipidemic - HMG CoA Reductase Inhibitors (statins) Cozaar 50 mg 1 oral qd 2021-04-22 ------Angiotensin II Receptor Blockers (ARBs) MagOx 400 mg 1 oral qd 2021-04-22 ------Minerals & Electrolytes - Magnesium Ecotrin Low Strength 81 mg 1 oral qd 2021-04-22 ------Platelet Aggregation Inhibitors - Salicylates Strength QTY Sig Date    Family and Social History  Patient is Married. Non-smoker Recode:  4 ETOH: None Lives with family Mother age 33; Father: deceased age 19,causeofdeathunknown. 3 brothers; 4 sisters.; Hypertension in mother, father, sister and brother MI in brother Cholesterol mother and father No known family history of Diabetes, Stroke, Bleeding, Blood clots, Neurologic conditions    REVIEW OF SYSTEMS  General: No history of change in weight, fever, chills, change in energy, fatigue or changes in sleep habits. Opthalmic: Negative for change in vision / eye problems. Ear, nose throat positive for hearing loss bilateral ears; negative for tinnitus, hoarseness, dysphagia, nasal congenstion, sinus pain, sore throat, epistaxis and other ENT complaint Respiratory: Denies SOB, cough and other respiratory symptoms. Cardiac: Denies chest pain, CHF and other cardiac symptoms. GI: No changes in bowel habits, abdominal pain or other GI complaint. Male GU: Denies significant male genitourinary symptoms. Patient notes hip pain on musculoskeletal system review; negative for arthritis, back pain, neck pain, knee pain, joint swelling, AM stiffness, dry eyes, dry mouth, skin rashes, tick exposure and other musculoskeletal symptoms. Current musculoskeletal complaints limit activity to a minor degree. Neurological: Denies cephalgia, seizures, weakness, numbness and other neurologic symptoms. Endocrine: Denies polyuria, polydypsia and other endocrine related complaints. Behavioral: Denies significant mood and psychological symptoms. Hemeatologic: Denies anemia, bleeding / clotting problems and other blood related complaints. Patient has history of diagnosed OSA Hg A1C rec based on age, BMI RCRI, no risk factors, risk of cardiac death, nonfatal MI and cardiac arrest, 0.4%. Risk of MI, Pulmonary edema, ventricular fib, cardiac arrest and complete heart block 0.5% 2  POUR / IPSS score 0. Score classified as mild level of symptoms, with no or mild increase in risk of urinary retention. Charlson score: Enter PMFSH then  select calculate score resulting in estimated in hospital mortality rate of 1.2%  EXAM  BMI 27.41; Temp: ; HR ; BP /; H: 5\' 7" ; Wt 175lb pO2 / RA. Ideal Body weight target below 159.64 lbs. Reviewed BMI and local resources for weight management.  Right hip: Internal and external rotation reproduces significant pain in the groin, no tenderness over trochanteric bursa on palpation, no swelling or ecchymosis noted, positive straight leg raise, Homans' sign is negative-no calf pain on palpation, pedal pulses are palpable, and neurovascular exams unremarkable. General exam   BMI   Cardiac, respiratory and mental status NORMAL    X-RAYS  Date Name Review 04/26/2021 R Hip MR without contrast Review of outside study & report. OA KL grade Kelligren Larsen grade 4 end stage arthritis with significant joint space loss, cysts and spurs. 03/03/2021 R HIP Pelvis 2 view Right Hip. Kelligren Larsen grade 3 moderate changes with some joint space narrowing and early to moderate bone changes. Acetabulum shows central osteophyte, marginal osteophyte and subchondral cysts. 1. Moderate narrowing in the right hip joint space that progressed since 2017. 2. Advanced degeneration in at least the L3-L4 disc as imaged. 3. No definite acute bony pelvic or right hip abnormalities.    Other Treatments reviewed  Date Name Review    ASSESSMENT  OA right hip--severe    Diagnosis  PMFSH Diagnosis  OA right hip-severe with declining functional phase    RECOMMENDATION  Treatment: Right THA  Discussed continue conservative care with injections versus surgical interventions. Patient states due to failed conservative care and severity and length of symptoms and declining quality of life she wishes to proceed with su rgical interventions. Discussed the risks, benefits, complications, and expected outcomes all in detail including the risk of infection and neurovascular compromise.   Discussed post-op plan of care. Patients family will be assisting at home  with care, order given for assisted devices, and discussed DVT prophylaxis. Stop [place]-history of sleep apnea, Cardiac 0, Urinary 1, ASA 2, and [place] 3. Pre-risk stratifies to ASA. Encouraged patient to call office if any problems or questions arise preoperative ly or postoperatively. The discussed continued until the patient understood and had no further questions. Discussed 2018 and shared decision-making materials given and reviewed in detail. Pt. voiced understanding. HOOS completed.  Medical clearance and optimization completed and reviewed.    Insert update      Orders:   Date Name Review 04/26/2021 R Hip MR without contrast Review of outside study & report. OA KL grade Kelligren Larsen grade 4 end stage arthritis with significant joint space loss, cysts and spurs. 03/03/2021 R HIP Pelvis 2 view Right Hip. Kelligren Larsen grade 3 moderate changes with some joint space narrowing and early to moderate bone changes. Acetabulum shows central osteophyte, marginal osteophyte and subchondral cysts. 1. Moderate narrowing in the right hip joint space that progressed since 2017. 2. Advanced degeneration in at least the L3-L4 disc as imaged. 3. No definite acute bony pelvic or right hip abnormalities. 01/07/2016 R Hip XR 2V Right Hip.  Date Name Review 04/30/2021 Bedside commode N/A 04/30/2021 Wheeled walker N/A    RETURN / FOLLOW-UP   finalized surgery with Dr. 14/01/2021, MD Tresa Garter, FNP-C

## 2021-08-13 ENCOUNTER — Other Ambulatory Visit: Payer: Self-pay

## 2021-08-13 ENCOUNTER — Inpatient Hospital Stay (HOSPITAL_COMMUNITY): Payer: Medicare PPO | Admitting: Anesthesiology

## 2021-08-13 ENCOUNTER — Observation Stay (HOSPITAL_COMMUNITY): Payer: Medicare PPO | Admitting: Orthopaedic Surgery

## 2021-08-13 ENCOUNTER — Encounter (HOSPITAL_COMMUNITY): Payer: Self-pay | Admitting: Orthopaedic Surgery

## 2021-08-13 ENCOUNTER — Observation Stay
Admission: RE | Admit: 2021-08-13 | Discharge: 2021-08-14 | Disposition: A | Discharge status: Home / Self Care | Payer: Medicare PPO | Source: Ambulatory Visit | Attending: Orthopaedic Surgery | Admitting: Orthopaedic Surgery

## 2021-08-13 ENCOUNTER — Encounter (HOSPITAL_COMMUNITY)
Admission: RE | Disposition: A | Discharge status: Home / Self Care | Payer: Self-pay | Source: Ambulatory Visit | Attending: Orthopaedic Surgery

## 2021-08-13 DIAGNOSIS — E7800 Pure hypercholesterolemia, unspecified: Secondary | ICD-10-CM

## 2021-08-13 DIAGNOSIS — Z7982 Long term (current) use of aspirin: Secondary | ICD-10-CM | POA: Insufficient documentation

## 2021-08-13 DIAGNOSIS — I10 Essential (primary) hypertension: Secondary | ICD-10-CM | POA: Insufficient documentation

## 2021-08-13 DIAGNOSIS — M1990 Unspecified osteoarthritis, unspecified site: Secondary | ICD-10-CM | POA: Insufficient documentation

## 2021-08-13 DIAGNOSIS — E785 Hyperlipidemia, unspecified: Secondary | ICD-10-CM | POA: Insufficient documentation

## 2021-08-13 DIAGNOSIS — M1611 Unilateral primary osteoarthritis, right hip: Principal | ICD-10-CM | POA: Insufficient documentation

## 2021-08-13 DIAGNOSIS — G4730 Sleep apnea, unspecified: Secondary | ICD-10-CM | POA: Insufficient documentation

## 2021-08-13 DIAGNOSIS — Z79899 Other long term (current) drug therapy: Secondary | ICD-10-CM | POA: Insufficient documentation

## 2021-08-13 DIAGNOSIS — Z96641 Presence of right artificial hip joint: Principal | ICD-10-CM

## 2021-08-13 HISTORY — DX: Unspecified osteoarthritis, unspecified site: M19.90

## 2021-08-13 HISTORY — DX: Essential (primary) hypertension: I10

## 2021-08-13 SURGERY — ARTHROPLASTY HIP TOTAL
Anesthesia: General | Site: Hip | Laterality: Right | Wound class: Clean Wound: Uninfected operative wounds in which no inflammation occurred

## 2021-08-13 MED ORDER — BISACODYL 10 MG RECTAL SUPPOSITORY
10.0000 mg | Freq: Every day | RECTAL | Status: DC | PRN
Start: 2021-08-13 — End: 2021-08-14

## 2021-08-13 MED ORDER — ROCURONIUM 10 MG/ML INTRAVENOUS SYRINGE WRAPPER
INJECTION | Freq: Once | INTRAVENOUS | Status: DC | PRN
Start: 2021-08-13 — End: 2021-08-13
  Administered 2021-08-13: 40 mg via INTRAVENOUS

## 2021-08-13 MED ORDER — SODIUM CHLORIDE 0.9 % INTRAVENOUS PIGGYBACK
2.0000 g | Freq: Once | INTRAVENOUS | Status: AC
Start: 2021-08-13 — End: 2021-08-13
  Administered 2021-08-13: 2 g via INTRAVENOUS

## 2021-08-13 MED ORDER — DEXAMETHASONE SODIUM PHOSPHATE (PF) 10 MG/ML INJECTION SOLUTION
8.0000 mg | Freq: Two times a day (BID) | INTRAMUSCULAR | Status: AC
Start: 2021-08-13 — End: 2021-08-14
  Administered 2021-08-13 – 2021-08-14 (×2): 8 mg via INTRAVENOUS
  Filled 2021-08-13 (×2): qty 1

## 2021-08-13 MED ORDER — ACETAMINOPHEN 325 MG TABLET
ORAL_TABLET | ORAL | Status: AC
Start: 2021-08-13 — End: 2021-08-13
  Filled 2021-08-13: qty 3

## 2021-08-13 MED ORDER — APREPITANT 40 MG CAPSULE
40.0000 mg | ORAL_CAPSULE | Freq: Once | ORAL | Status: AC
Start: 2021-08-13 — End: 2021-08-13
  Administered 2021-08-13: 40 mg via ORAL

## 2021-08-13 MED ORDER — KETOROLAC 30 MG/ML (1 ML) INJECTION SOLUTION
15.0000 mg | Freq: Three times a day (TID) | INTRAMUSCULAR | Status: DC
Start: 2021-08-13 — End: 2021-08-14
  Administered 2021-08-13 – 2021-08-14 (×3): 15 mg via INTRAVENOUS
  Filled 2021-08-13 (×3): qty 1

## 2021-08-13 MED ORDER — MIDAZOLAM 5 MG/ML INJECTION WRAPPER
2.0000 mg | Freq: Once | INTRAMUSCULAR | Status: DC | PRN
Start: 2021-08-13 — End: 2021-08-13
  Administered 2021-08-13: 2 mg via INTRAVENOUS

## 2021-08-13 MED ORDER — TRAMADOL 50 MG TABLET
50.0000 mg | ORAL_TABLET | Freq: Four times a day (QID) | ORAL | Status: DC
Start: 2021-08-13 — End: 2021-08-14
  Administered 2021-08-13 – 2021-08-14 (×4): 50 mg via ORAL
  Filled 2021-08-13 (×4): qty 1

## 2021-08-13 MED ORDER — ROPIVACAINE (PF) 2 MG/ML (0.2 %) INJECTION SOLUTION
INTRAMUSCULAR | Status: AC
Start: 2021-08-13 — End: 2021-08-13
  Filled 2021-08-13: qty 10

## 2021-08-13 MED ORDER — CELECOXIB 100 MG CAPSULE
ORAL_CAPSULE | ORAL | Status: AC
Start: 2021-08-13 — End: 2021-08-13
  Filled 2021-08-13: qty 2

## 2021-08-13 MED ORDER — DOCUSATE SODIUM 100 MG CAPSULE
100.0000 mg | ORAL_CAPSULE | Freq: Two times a day (BID) | ORAL | Status: DC
Start: 2021-08-13 — End: 2021-08-14
  Administered 2021-08-13 – 2021-08-14 (×3): 100 mg via ORAL
  Filled 2021-08-13 (×3): qty 1

## 2021-08-13 MED ORDER — ONDANSETRON HCL (PF) 4 MG/2 ML INJECTION SOLUTION
INTRAMUSCULAR | Status: AC
Start: 2021-08-13 — End: 2021-08-13
  Filled 2021-08-13: qty 4

## 2021-08-13 MED ORDER — SODIUM CHLORIDE 0.9 % (FLUSH) INJECTION SYRINGE
3.0000 mL | INJECTION | INTRAMUSCULAR | Status: DC | PRN
Start: 2021-08-13 — End: 2021-08-14

## 2021-08-13 MED ORDER — MULTIVITAMIN-FERROUS FUMARATE-FOLIC ACID 18 MG-400 MCG TABLET
1.0000 | ORAL_TABLET | Freq: Every day | ORAL | Status: DC
Start: 2021-08-13 — End: 2021-08-14
  Administered 2021-08-13 – 2021-08-14 (×2): 1 via ORAL
  Filled 2021-08-13 (×2): qty 1

## 2021-08-13 MED ORDER — FAMOTIDINE (PF) 20 MG/2 ML INTRAVENOUS SOLUTION
INTRAVENOUS | Status: AC
Start: 2021-08-13 — End: 2021-08-13
  Filled 2021-08-13: qty 2

## 2021-08-13 MED ORDER — DEXAMETHASONE SODIUM PHOSPHATE 4 MG/ML INJECTION SOLUTION
4.0000 mg | Freq: Once | INTRAMUSCULAR | Status: DC
Start: 2021-08-13 — End: 2021-08-13

## 2021-08-13 MED ORDER — GABAPENTIN 300 MG CAPSULE
300.0000 mg | ORAL_CAPSULE | Freq: Once | ORAL | Status: AC
Start: 2021-08-13 — End: 2021-08-13
  Administered 2021-08-13: 300 mg via ORAL

## 2021-08-13 MED ORDER — SODIUM CHLORIDE 0.9 % INTRAVENOUS PIGGYBACK
INJECTION | INTRAVENOUS | Status: AC
Start: 2021-08-13 — End: 2021-08-13
  Filled 2021-08-13: qty 100

## 2021-08-13 MED ORDER — ONDANSETRON HCL (PF) 4 MG/2 ML INJECTION SOLUTION
4.0000 mg | Freq: Once | INTRAMUSCULAR | Status: DC | PRN
Start: 2021-08-13 — End: 2021-08-13

## 2021-08-13 MED ORDER — ALBUTEROL SULFATE 2.5 MG/3 ML (0.083 %) SOLUTION FOR NEBULIZATION
2.5000 mg | INHALATION_SOLUTION | Freq: Once | RESPIRATORY_TRACT | Status: DC | PRN
Start: 2021-08-13 — End: 2021-08-13

## 2021-08-13 MED ORDER — TRANEXAMIC ACID 1,000 MG/10 ML (100 MG/ML) INTRAVENOUS SOLUTION
1000.0000 mg | Freq: Once | INTRAVENOUS | Status: AC
Start: 2021-08-13 — End: 2021-08-13
  Administered 2021-08-13: 1000 mg via INTRAVENOUS

## 2021-08-13 MED ORDER — APREPITANT 40 MG CAPSULE
ORAL_CAPSULE | ORAL | Status: AC
Start: 2021-08-13 — End: 2021-08-13
  Filled 2021-08-13: qty 1

## 2021-08-13 MED ORDER — LACTATED RINGERS INTRAVENOUS SOLUTION
INTRAVENOUS | Status: DC
Start: 2021-08-13 — End: 2021-08-13

## 2021-08-13 MED ORDER — LIDOCAINE (PF) 100 MG/5 ML (2 %) INTRAVENOUS SYRINGE
INJECTION | Freq: Once | INTRAVENOUS | Status: DC | PRN
Start: 2021-08-13 — End: 2021-08-13
  Administered 2021-08-13: 80 mg via INTRAVENOUS

## 2021-08-13 MED ORDER — TRANEXAMIC ACID 1,000 MG/10 ML (100 MG/ML) INTRAVENOUS SOLUTION
INTRAVENOUS | Status: AC
Start: 2021-08-13 — End: 2021-08-13
  Filled 2021-08-13: qty 10

## 2021-08-13 MED ORDER — PHENYLEPHRINE 10 MG/ML INJECTION SOLUTION
Freq: Once | INTRAMUSCULAR | Status: DC | PRN
Start: 2021-08-13 — End: 2021-08-13
  Administered 2021-08-13 (×3): 100 ug via INTRAVENOUS

## 2021-08-13 MED ORDER — PROPOFOL 10 MG/ML IV BOLUS
INJECTION | Freq: Once | INTRAVENOUS | Status: DC | PRN
Start: 2021-08-13 — End: 2021-08-13
  Administered 2021-08-13: 160 mg via INTRAVENOUS

## 2021-08-13 MED ORDER — SODIUM CHLORIDE 0.9 % (FLUSH) INJECTION SYRINGE
3.0000 mL | INJECTION | INTRAMUSCULAR | Status: DC | PRN
Start: 2021-08-13 — End: 2021-08-13

## 2021-08-13 MED ORDER — SUGAMMADEX 100 MG/ML INTRAVENOUS SOLUTION
Freq: Once | INTRAVENOUS | Status: DC | PRN
Start: 2021-08-13 — End: 2021-08-13
  Administered 2021-08-13: 200 mg via INTRAVENOUS

## 2021-08-13 MED ORDER — ONDANSETRON HCL (PF) 4 MG/2 ML INJECTION SOLUTION
4.0000 mg | Freq: Once | INTRAMUSCULAR | Status: DC
Start: 2021-08-13 — End: 2021-08-13

## 2021-08-13 MED ORDER — ONDANSETRON HCL (PF) 4 MG/2 ML INJECTION SOLUTION
8.0000 mg | Freq: Once | INTRAMUSCULAR | Status: AC
Start: 2021-08-13 — End: 2021-08-13
  Administered 2021-08-13: 8 mg via INTRAVENOUS

## 2021-08-13 MED ORDER — ROPIVACAINE (PF) 2 MG/ML (0.2 %) INJECTION SOLUTION
Freq: Once | INTRAMUSCULAR | Status: DC | PRN
Start: 2021-08-13 — End: 2021-08-13
  Administered 2021-08-13: 10 mL

## 2021-08-13 MED ORDER — OXYCODONE ER 10 MG TABLET,CRUSH RESISTANT,EXTENDED RELEASE 12 HR
10.0000 mg | EXTENDED_RELEASE_ORAL_TABLET | Freq: Once | ORAL | Status: AC
Start: 2021-08-13 — End: 2021-08-13
  Administered 2021-08-13: 10 mg via ORAL

## 2021-08-13 MED ORDER — PROCHLORPERAZINE EDISYLATE 10 MG/2 ML (5 MG/ML) INJECTION SOLUTION
5.0000 mg | Freq: Once | INTRAMUSCULAR | Status: DC | PRN
Start: 2021-08-13 — End: 2021-08-13

## 2021-08-13 MED ORDER — FAMOTIDINE (PF) 20 MG/2 ML INTRAVENOUS SOLUTION
20.0000 mg | Freq: Once | INTRAVENOUS | Status: AC
Start: 2021-08-13 — End: 2021-08-13
  Administered 2021-08-13: 20 mg via INTRAVENOUS

## 2021-08-13 MED ORDER — SODIUM CHLORIDE 0.9 % (FLUSH) INJECTION SYRINGE
3.0000 mL | INJECTION | Freq: Three times a day (TID) | INTRAMUSCULAR | Status: DC
Start: 2021-08-13 — End: 2021-08-14
  Administered 2021-08-13: 3 mL
  Administered 2021-08-13 (×2): 0 mL
  Administered 2021-08-14: 3 mL

## 2021-08-13 MED ORDER — FENTANYL (PF) 50 MCG/ML INJECTION WRAPPER
50.0000 ug | INJECTION | INTRAMUSCULAR | Status: DC | PRN
Start: 2021-08-13 — End: 2021-08-13

## 2021-08-13 MED ORDER — NALOXONE 0.4 MG/ML INJECTION SOLUTION
0.4000 mg | INTRAMUSCULAR | Status: DC | PRN
Start: 2021-08-13 — End: 2021-08-14

## 2021-08-13 MED ORDER — FAMOTIDINE 20 MG TABLET
20.0000 mg | ORAL_TABLET | Freq: Two times a day (BID) | ORAL | Status: DC
Start: 2021-08-13 — End: 2021-08-14
  Administered 2021-08-13 – 2021-08-14 (×3): 20 mg via ORAL
  Filled 2021-08-13 (×3): qty 1

## 2021-08-13 MED ORDER — FENTANYL (PF) 50 MCG/ML INJECTION WRAPPER
INJECTION | Freq: Once | INTRAMUSCULAR | Status: DC | PRN
Start: 2021-08-13 — End: 2021-08-13
  Administered 2021-08-13 (×4): 50 ug via INTRAVENOUS

## 2021-08-13 MED ORDER — LACTATED RINGERS INTRAVENOUS SOLUTION
INTRAVENOUS | Status: DC
Start: 2021-08-13 — End: 2021-08-14
  Administered 2021-08-13: 0 mL via INTRAVENOUS

## 2021-08-13 MED ORDER — GABAPENTIN 300 MG CAPSULE
ORAL_CAPSULE | ORAL | Status: AC
Start: 2021-08-13 — End: 2021-08-13
  Filled 2021-08-13: qty 1

## 2021-08-13 MED ORDER — SODIUM CHLORIDE 0.9 % (FLUSH) INJECTION SYRINGE
3.0000 mL | INJECTION | Freq: Three times a day (TID) | INTRAMUSCULAR | Status: DC
Start: 2021-08-13 — End: 2021-08-13

## 2021-08-13 MED ORDER — FENTANYL (PF) 50 MCG/ML INJECTION WRAPPER
25.0000 ug | INJECTION | INTRAMUSCULAR | Status: DC | PRN
Start: 2021-08-13 — End: 2021-08-13

## 2021-08-13 MED ORDER — DEXAMETHASONE SODIUM PHOSPHATE (PF) 10 MG/ML INJECTION SOLUTION
10.0000 mg | Freq: Once | INTRAMUSCULAR | Status: AC
Start: 2021-08-13 — End: 2021-08-13
  Administered 2021-08-13: 10 mg via INTRAVENOUS

## 2021-08-13 MED ORDER — MIDAZOLAM 5 MG/ML INJECTION WRAPPER
INTRAMUSCULAR | Status: AC
Start: 2021-08-13 — End: 2021-08-13
  Filled 2021-08-13: qty 1

## 2021-08-13 MED ORDER — ETHYL ALCOHOL 62 % (NOZIN NASAL SANITIZER) NASAL SOLUTION - BULK BOTTLE
3.0000 | Freq: Once | NASAL | Status: DC
Start: 2021-08-13 — End: 2021-08-13

## 2021-08-13 MED ORDER — FENTANYL (PF) 50 MCG/ML INJECTION SOLUTION
INTRAMUSCULAR | Status: AC
Start: 2021-08-13 — End: 2021-08-13
  Filled 2021-08-13: qty 2

## 2021-08-13 MED ORDER — OXYCODONE ER 10 MG TABLET,CRUSH RESISTANT,EXTENDED RELEASE 12 HR
EXTENDED_RELEASE_ORAL_TABLET | ORAL | Status: AC
Start: 2021-08-13 — End: 2021-08-13
  Filled 2021-08-13: qty 1

## 2021-08-13 MED ORDER — SODIUM CHLORIDE 0.9 % (FLUSH) INJECTION SYRINGE
3.0000 mL | INJECTION | Freq: Three times a day (TID) | INTRAMUSCULAR | Status: DC
Start: 2021-08-13 — End: 2021-08-14
  Administered 2021-08-13: 0 mL
  Administered 2021-08-13 – 2021-08-14 (×2): 3 mL

## 2021-08-13 MED ORDER — OXYCODONE ER 10 MG TABLET,CRUSH RESISTANT,EXTENDED RELEASE 12 HR
10.0000 mg | EXTENDED_RELEASE_ORAL_TABLET | Freq: Once | ORAL | Status: AC
Start: 2021-08-13 — End: 2021-08-13
  Administered 2021-08-13: 10 mg via ORAL
  Filled 2021-08-13: qty 1

## 2021-08-13 MED ORDER — EPHEDRINE SULFATE 50 MG/ML INTRAVENOUS SOLUTION
Freq: Once | INTRAVENOUS | Status: DC | PRN
Start: 2021-08-13 — End: 2021-08-13
  Administered 2021-08-13 (×5): 10 mg via INTRAVENOUS

## 2021-08-13 MED ORDER — IPRATROPIUM 0.5 MG-ALBUTEROL 3 MG (2.5 MG BASE)/3 ML NEBULIZATION SOLN
3.0000 mL | INHALATION_SOLUTION | Freq: Once | RESPIRATORY_TRACT | Status: DC | PRN
Start: 2021-08-13 — End: 2021-08-13

## 2021-08-13 MED ORDER — CELECOXIB 100 MG CAPSULE
200.0000 mg | ORAL_CAPSULE | Freq: Once | ORAL | Status: AC
Start: 2021-08-13 — End: 2021-08-13
  Administered 2021-08-13: 200 mg via ORAL

## 2021-08-13 MED ORDER — ETHYL ALCOHOL 62 % (NOZIN NASAL SANITIZER) NASAL SOLUTION - BULK BOTTLE
1.0000 | Freq: Two times a day (BID) | NASAL | Status: DC
Start: 2021-08-13 — End: 2021-08-14
  Administered 2021-08-13 – 2021-08-14 (×3): 1 via NASAL

## 2021-08-13 MED ORDER — ACETAMINOPHEN 325 MG TABLET
975.0000 mg | ORAL_TABLET | Freq: Once | ORAL | Status: AC
Start: 2021-08-13 — End: 2021-08-13
  Administered 2021-08-13: 975 mg via ORAL

## 2021-08-13 MED ORDER — DEXAMETHASONE SODIUM PHOSPHATE (PF) 10 MG/ML INJECTION SOLUTION
INTRAMUSCULAR | Status: AC
Start: 2021-08-13 — End: 2021-08-13
  Filled 2021-08-13: qty 1

## 2021-08-13 MED ORDER — LACTATED RINGERS INTRAVENOUS SOLUTION
INTRAVENOUS | Status: DC
Start: 2021-08-13 — End: 2021-08-14
  Administered 2021-08-13: 0 via INTRAVENOUS

## 2021-08-13 MED ORDER — ONDANSETRON HCL (PF) 4 MG/2 ML INJECTION SOLUTION
4.0000 mg | INTRAMUSCULAR | Status: DC | PRN
Start: 2021-08-13 — End: 2021-08-14

## 2021-08-13 MED ORDER — POTASSIUM CHLORIDE 20 MEQ/L IN 0.9 % SODIUM CHLORIDE INTRAVENOUS
INTRAVENOUS | Status: DC
Start: 2021-08-13 — End: 2021-08-14
  Administered 2021-08-13: 0 mL/h via INTRAVENOUS

## 2021-08-13 MED ORDER — CEFAZOLIN 1 GRAM SOLUTION FOR INJECTION
INTRAMUSCULAR | Status: AC
Start: 2021-08-13 — End: 2021-08-13
  Filled 2021-08-13: qty 20

## 2021-08-13 MED ORDER — ASPIRIN 81 MG CHEWABLE TABLET
81.0000 mg | CHEWABLE_TABLET | Freq: Two times a day (BID) | ORAL | Status: DC
Start: 2021-08-14 — End: 2021-08-14
  Administered 2021-08-14: 81 mg via ORAL
  Filled 2021-08-13: qty 1

## 2021-08-13 MED ORDER — HYDROCODONE 7.5 MG-ACETAMINOPHEN 325 MG TABLET
1.0000 | ORAL_TABLET | Freq: Four times a day (QID) | ORAL | 0 refills | Status: AC | PRN
Start: 2021-08-13 — End: 2021-08-16

## 2021-08-13 SURGICAL SUPPLY — 90 items
ADH SKNCLS 2-OCTYL CYNCRLT DRMBND PRINEO LIQUID 22CM (SUTURE/WOUND CLOSURE) ×2 IMPLANT
BAG BIOHAZ RD 30X24IN THK3 MIL C8-10GL LLDPE INFCT WASTE CAN (MED SURG SUPPLIES) ×2
BAG BIOHAZ RD 30X24IN THK3 MIL C8-10GL LLDPE INFCT WASTE CAN PNCT RST (MED SURG SUPPLIES) ×2
BAG BIOHAZ RD 43X30IN THK3 MIL C20-30GL LLDPE INFCT WASTE (MED SURG SUPPLIES) ×1
BAG BIOHAZ RD 43X30IN THK3 MIL C20-30GL LLDPE INFCT WASTE CAN PNCT RST (MED SURG SUPPLIES) ×1 IMPLANT
BLADE 10 2 END CBNSTL SURG STRL DISP (CUTTING ELEMENTS) ×5
BLADE 10 2 END CBNSTL SURG STRL DISP (SURGICAL CUTTING SUPPLIES) ×5 IMPLANT
BLADE SAW 3.543X.828IN SGTL THK.05IN STRYK PRFRM SER STERNUM SYS 6 (SURGICAL CUTTING SUPPLIES) ×1 IMPLANT
BLADE SAW 3.543X.828IN SGTL TH_K.05IN STRYK PRFRM SER SYS 6 (CUTTING ELEMENTS) ×1
CLEANER INSTR PREPZYME MUL-TRD CONTAINR NARSL NEUT PH BDGR (MISCELLANEOUS PT CARE ITEMS) ×1
CONV USE 102436 - NEEDLE HYPO  22GA 1.5IN STD MONOJECT SS POLYPROP REG BVL LL HUB UL SHRP ANTICORE BLU STRL LF  DISP (MED SURG SUPPLIES) ×1 IMPLANT
CONV USE 86875 - ELECTRODE ESURG BLADE 6IN 3/32IN SS LF (SURGICAL CUTTING SUPPLIES) ×1 IMPLANT
CONV USE ITEM 14518 - SUTURE 5 KV-37 TICRON 30IN BLU BRD 5 STRN COAT NONAB (SUTURE/WOUND CLOSURE) ×1 IMPLANT
CONV USE ITEM 321837 - GLOVE SURG 7.5 LTX PF NONST CRM (GLOVES AND ACCESSORIES) ×1 IMPLANT
CONV USE ITEM 321852 - GLOVE SURG 6.5 LF  PLISPRN (GLOVES AND ACCESSORIES) ×1 IMPLANT
CONV USE ITEM 321983 - GLOVE SURG 8 LTX CHEMO PF SMOOTH BEAD CUF STRL WHT 11.6IN PLMR THK.2MM THK.21MM (GLOVES AND ACCESSORIES) ×1 IMPLANT
CONV USE ITEM 322852 - ROLL ECODRI-SAFE ABS POLY BCK ORTHO (ORTHOPEDICS (NOT IMPLANTS)) ×1 IMPLANT
CONV USE ITEM 323185 - PAD EG 15SQ IN UNIV FOAM SPLT NONCORD ADULT 9100 SER (SURGICAL CUTTING SUPPLIES) ×1 IMPLANT
CONV USE ITEM 329146 - CLEANER INSTR PREPZYME MUL-TRD CONTAINR NARSL NEUT PH BDGR 22OZ (MISCELLANEOUS PT CARE ITEMS) ×1 IMPLANT
CONV USE ITEM 338662 - PACK SURG ASCP STRL DISP ~~LOC~~ BPT MED CNTR LF (CUSTOM TRAYS & PACK) ×1 IMPLANT
CONV USE ITEM 34153 - ELECTRODE ESURG BLADE PNCL 3/32IN STRL SS CAUT PSHBTN STD SHAFT LF  VEGA SER (SURGICAL CUTTING SUPPLIES) ×1 IMPLANT
CONV USE ITEM 343591 - SOLIDIFY FLUID 1500CC NONST LF  PREM SOLIDIFY + (MED SURG SUPPLIES) ×1 IMPLANT
CONV USE ITEM 81225 - BAG BIOHAZ RD 30X24IN THK3 MIL C8-10GL LLDPE INFCT WASTE CAN PNCT RST (MED SURG SUPPLIES) ×2 IMPLANT
COUNTER 20 CNT BLOCK ADH NEEDLE STRL LF  RD SHARP FOAM 15.75X11.5X14IN DISP (MED SURG SUPPLIES) ×1 IMPLANT
COUNTER 20 CNT BLOCK ADH NEEDLE STRL LF RD SHARP FOAM 15.75 (MED SURG SUPPLIES) ×1
COVER TBL 90X50IN STD SMS REINF FNFLD STRL LF  DISP (DRAPE/PACKS/SHEETS/OR TOWEL) ×2 IMPLANT
COVER TBL 90X50IN STD SMS REINF FNFLD STRL LF DISP (DRAPE/PACKS/SHEETS/OR TOWEL) ×2
DEVICE CLSR STRATAFIX PDS + 18IN ABS KNOTLESS TISS CONTROL (SUTURE/WOUND CLOSURE) ×1
DEVICE SUCT 2 FILTER CHAMBER SCKR STRL LF  DISP (MED SURG SUPPLIES) ×2 IMPLANT
DEVICE SUT STRATAFIX PDS + 45C_M ABS KNTLS TISS CONTROL SMTR (SUTURE/WOUND CLOSURE) ×1
DISC USE ITEM 306517 - GLOVE SURG 8.5 LF PF SMOOTH BEAD CUF STRL GRN 12IN (GLOVES AND ACCESSORIES) ×1 IMPLANT
DRAPE 2 INCS FILM ANTIMIC 33X23IN IOBN STRL SURG (DRAPE/PACKS/SHEETS/OR TOWEL) ×1 IMPLANT
DRAPE 2 INCS FILM ANTIMIC 33X2_3IN IOBN STRL SURG (DRAPE/PACKS/SHEETS/OR TOWEL) ×1
DURAPREP 26ML 8630 CS/20 (MED SURG SUPPLIES) ×1
ELECTRODE ESURG BLADE 6.5IN EX_T (CUTTING ELEMENTS) ×1
ELECTRODE ESURG BLADE 6IN 3/32IN SS LF (CUTTING ELEMENTS) ×1
ELECTRODE ESURG BLADE PNCL 3/32IN STRL SS CAUT PSHBTN STD (CUTTING ELEMENTS) ×1
GLOVE SURG 6.5 LF  PF BEAD CUF SMOOTH TXTR STRL GRN 12IN SENSICARE PLISPRN SYN PLMR ALOE THK7.9 MIL (GLOVES AND ACCESSORIES) ×1 IMPLANT
GLOVE SURG 6.5 LF PF BEAD CUF SMOOTH TXTR STRL GRN 12IN (GLOVES AND ACCESSORIES) ×1
GLOVE SURG 6.5 LF PF SMOOTH STRL WHT PLISPRN (GLOVES AND ACCESSORIES) ×1
GLOVE SURG 6.5 LTX PF BEAD CUF MICRO ROUGHEN N-PYRG STRL STRW BGL SRG CURVE FINGER (GLOVES AND ACCESSORIES) ×1 IMPLANT
GLOVE SURG 6.5 LTX PF BEAD CUF_MICRO RGH N-PYRG STRL STRW (GLOVES AND ACCESSORIES) ×1
GLOVE SURG 7 LF  PF SMOOTH TXTR BEAD CUF STRL GRN 12IN SENSICARE PI PLISPRN SYN PLMR ALOE THK7.9 MIL (GLOVES AND ACCESSORIES) ×1 IMPLANT
GLOVE SURG 7 LF PF SMOOTH TXTR BEAD CUF STRL GRN 12IN (GLOVES AND ACCESSORIES) ×1
GLOVE SURG 7.5 LF PF SMOOTH STRL WHT PLISPRN (GLOVES AND ACCESSORIES) ×1
GLOVE SURG 7.5 LTX PF SMOOTH STRL CRM (GLOVES AND ACCESSORIES) ×1
GLOVE SURG 8 LTX PF SMOOTH STRL CRM (GLOVES AND ACCESSORIES) ×1
GLOVE SURG 8.5 LF PF SMOOTH BEAD CUF STRL GRN 12IN (GLOVES AND ACCESSORIES) ×1
GLOVE SURG LF  PF STRL 7.5 PLISPRN DISP (GLOVES AND ACCESSORIES) ×1 IMPLANT
HANDPC PLS LAV INTPLS SUCT FAN SPRAY TIP (MED SURG SUPPLIES) ×1
HDPE THK22 UM C40-45 GL L48 IN X W40 IN NATURAL (MISCELLANEOUS PT CARE ITEMS) ×6 IMPLANT
HEAD V40 36MM +0MM OFST TAPER HIP BLX D FEM STRL (IMPLANTS HIP) ×1 IMPLANT
HEMOSTAT ABS 14X2IN FLXB SHR W_V SRGCL STRL DISP (WOUND CARE SUPPLY) ×1 IMPLANT
HEMOSTAT ABS 14X2IN FLXB SHR W_V SRGCL STRL DISP (WOUND CARE/ENTEROSTOMAL SUPPLY) ×1
INSERT ACET TRIDENT 36MM 10D E X3 HIP (IMPLANTS HIP) ×1 IMPLANT
LABEL MED CORRECT MED LABELING SYS 4 FLG 2 SHEET 24 PRPRNT (MED SURG SUPPLIES) ×1
LABEL MED CORRECT MED LABELING SYS 4 FLG 2 SHEET 24 PRPRNT STRL (MED SURG SUPPLIES) ×1 IMPLANT
MANIFLD SUCT INTELLICART CLOG FREE DISP (MED SURG SUPPLIES) ×2 IMPLANT
MATTRESS TRANSF 34IN HOVERMATT BRTHBL NONST LF  DISP (MED SURG SUPPLIES) ×1 IMPLANT
MATTRESS TRANSF 34IN HOVERMATT_BRTHBL NONST LF DISP (MED SURG SUPPLIES) ×1
NEEDLE HYPO 22GA 1.5IN STD MONOJECT SS POLYPROP REG BVL LL (MED SURG SUPPLIES) ×2
PACK ARTHROGRAM MDLN INDUSTRIES INC. RAD GEN N/M S DISP (CUSTOM TRAYS & PACK) ×1
PACK SURG ASCP STRL DISP ~~LOC~~ BPT MED CNTR LF (CUSTOM TRAYS & PACK) ×1
PAD EG 15SQ IN UNIV FOAM SPLT NONCORD ADULT 9100 SER (CUTTING ELEMENTS) ×1
ROLL ECODRI-SAFE ABS POLY BCK ORTHO (ORTHOPEDICS (NOT IMPLANTS)) ×1
SET INTPLS SUCT TUBE FAN SPRAY TIP HANDPC STRL LF  DISP (MED SURG SUPPLIES) ×1 IMPLANT
SHELL ACET 54MM HIP SLD BCK TRIT TRIDENT II E STRL (IMPLANTS HIP) ×1 IMPLANT
SOL IRRG 0.9% NACL 2000ML PRSV FR N-PYRG FLXB CONTAINR STRL (MEDICATIONS/SOLUTIONS) ×1
SOL IRRG 0.9% NACL 2000ML PRSV FR N-PYRG FLXB CONTAINR STRL LF (MEDICATIONS/SOLUTIONS) ×1 IMPLANT
SOL SURG PREP 26ML DRPRP 74% ISPRP 0.7% IOD POVACRYLEX SLF CNTN APPL SKIN STRL PREOP (MED SURG SUPPLIES) ×1 IMPLANT
SOLIDIFY FLUID 1500CC NONST LF  PREM SOLIDIFY + (MED SURG SUPPLIES) ×1
SOLIDIFY FLUID 1500CC NONST LF PREM SOLIDIFY + (MED SURG SUPPLIES) ×1
SPONGE LAP 18X18IN PREWASH RIGID TRY STRL LF  WHT (MED SURG SUPPLIES) ×3 IMPLANT
SPONGE LAP 18X18IN PREWASH RIGID TRY STRL LF WHT (MED SURG SUPPLIES) ×3
STEM FEM ACCOLADE V40 108MM PRFT PUREFIX TI HIP 132D 5 40MM STD OFST STRL (IMPLANTS HIP) ×1 IMPLANT
SUTURE 1 CT STRATAFIX PDS + 18IN VIOL ABS KNOTLESS TISS CONTROL SMTR ABS (SUTURE/WOUND CLOSURE) ×1 IMPLANT
SUTURE 1 GS-24 POLYSRB 36IN VIOL BRD COAT ABS (SUTURE/WOUND CLOSURE) ×4 IMPLANT
SUTURE 2-0 CT1 STRATAFIX PDS + 18IN VIOL ABS KNOTLESS TISS CONTROL SMTR ABS (SUTURE/WOUND CLOSURE) ×1 IMPLANT
SUTURE 2-0 GS-21 POLYSRB 30IN UNDYED BRD COAT ABS (SUTURE/WOUND CLOSURE) ×1 IMPLANT
SUTURE 2-0 GS-21 POLYSRB_30IN BRD COAT ABS (SUTURE/WOUND CLOSURE) ×1
SUTURE 3-0 C-13 BIOSYN 30IN UNDYED MONOF ABS (SUTURE/WOUND CLOSURE) ×2 IMPLANT
SUTURE 4-0 C-14 SURGIPRO2 18IN BLU MONOF NONAB (SUTURE/WOUND CLOSURE) ×2 IMPLANT
SUTURE 5 KV-37 TICRON 30IN BLU BRD 5 STRN COAT NONAB (SUTURE/WOUND CLOSURE) ×1
SYRINGE LL 10ML LF  STRL GRAD N-PYRG DEHP-FR PVC FREE MED DISP (MED SURG SUPPLIES) ×1 IMPLANT
SYRINGE LL 10ML LF STRL MED D_ISP (MED SURG SUPPLIES) ×1
TOWEL 24X16IN COTTON BLU DISP SURG STRL LF (DRAPE/PACKS/SHEETS/OR TOWEL) ×4 IMPLANT
WAX BONE HMST AGENT LUKENS STRL (WOUND CARE SUPPLY) ×1 IMPLANT
WAX BONE HMST AGNT LUKENS (WOUND CARE/ENTEROSTOMAL SUPPLY) ×1
WOUND IRRG IRRISEPT DBRD CLNSG 0.05% CHG SYSTEM STRL LF (WOUND CARE SUPPLY) ×1 IMPLANT
WOUND IRRG IRRISEPT DBRD CLNSG_0.05% CHG SYSTEM STRL LF (WOUND CARE/ENTEROSTOMAL SUPPLY) ×1

## 2021-08-13 NOTE — Anesthesia Transfer of Care (Signed)
ANESTHESIA TRANSFER OF CARE   Travis Brooks is a 66 y.o. ,male, Weight: 83.9 kg (185 lb)   had Procedure(s):  RIGHT TOTAL HIP ARTHROPLASTY USING NONCEMENTED ACCOLADE II IMPLANTS  performed  08/13/21   Primary Service: Alexia Freestone, MD    Past Medical History:   Diagnosis Date   . Hypertension    . Osteoarthritis       Allergy History as of 08/13/21      No Known Allergies              I completed my transfer of care / handoff to the receiving personnel during which we discussed:  Access, Airway, All key/critical aspects of case discussed, Analgesia, Antibiotics, Expectation of post procedure, Fluids/Product, Gave opportunity for questions and acknowledgement of understanding, Labs and PMHx    Post Location: PACU                                       Report given to: Randall An, LPN                           Last OR Temp: Temperature: 36.2 C (97.2 F)  ABG:   Airway:* No LDAs found *  Blood pressure 118/64, pulse 80, temperature 36.2 C (97.2 F), resp. rate 16, height 1.702 m (5\' 7" ), weight 83.9 kg (185 lb), SpO2 98 %.

## 2021-08-13 NOTE — Care Plan (Signed)
[facility]  [address]  [place], [id number]  [phone](Office) [phone]  (Fax) [phone]  Rehabilitation Services  Physical Therapy Inpatient THA Initial Evaluation    Patient Name: Travis Brooks  Date of Birth: 1955-08-19  Height: Height: 170.2 cm (5' 7.01")  Weight: Weight: 83.9 kg (184 lb 15.5 oz)  Room/Bed: 371/A  Payor: AET[employer]-MEDI-ADV / Plan: [employer] MEDICARE ADVANTAGE PPO / Product Type: PPO /       PMH:  Past Medical History:   Diagnosis Date    Hypertension     Osteoarthritis            Assessment:      (P) Patient tolerated PT evaluation well and achieved ambulation goal for distance on DOS. He improved his gait quality as he progressed in distance. He required CGA for all bed mobility, transfers and gait with minimal verbal cues. Prior to activity a detailed discussion about THR dislocation precautions was completed with patient, his wife and 2 other family members. They asked good questions which were answered. He will benefit from further PT services to progress through postop physician protocol including stair training, HEP instruction, further transfer and gait training, strengthening, review of THR precautions and safety.    Discharge Needs:    Equipment Recommendation: (P) front wheeled walker, reacher      The patient presents with mobility limitations due to impaired range of motion, impaired strength, weight bearing restrictions, impaired functional activity tolerance, and THR dislocation precautions  that significantly impair/prevent patient's ability to participate in mobility-related activities of daily living (MRADLs) including  ambulation and transfers in order to safely complete, toileting, bathing, safely entering/exiting the home. This functional mobility deficit can be sufficiently resolved with the use of a (P) front wheeled walker, reacher  in order to decrease the risk of falls, morbidity, and mortality in performance of these MRADLs.  Patient is  able to safely use this assistive device.    Discharge Disposition: (P) home with assist    JUSTIFICATION OF DISCHARGE RECOMMENDATION   Based on current diagnosis, functional performance prior to admission, and current functional performance, this patient requires continued PT services in (P) home with assist in order to achieve significant functional improvements in these deficit areas: (P) gait, locomotion, and balance, muscle performance, ROM (range of motion) (transfers).        Plan:   Current Intervention: (P) bed mobility training, gait training, home exercise program, patient/family education, ROM (range of motion), stair training, strengthening, transfer training  To provide physical therapy services (P) other (see comments) (1-3x daily Monday through Saturday)  for duration of (P) until discharge.    The risks/benefits of therapy have been discussed with the patient/caregiver and he/she is in agreement with the established plan of care.       Subjective & Objective     Past Medical History:   Diagnosis Date    Hypertension     Osteoarthritis             Past Surgical History:   Procedure Laterality Date    COLON SURGERY      HX BACK SURGERY      HX HERNIA REPAIR          08/13/21 1545   Rehab Session   Document Type evaluation   Total PT Minutes: 60   Patient Effort excellent   Symptoms Noted During/After Treatment nausea;dizziness   Symptoms Noted Comment both mild symptoms   General Information   Patient  Profile Reviewed yes   Onset of Illness/Injury or Date of Surgery 08/13/21   Pertinent History of Current Functional Problem Admitted 08/13/21 to undergo an elective right THA secondary to endstage OA which has not responded to conservative measures.   Medical Lines PIV Line   Respiratory Status room air   Existing Precautions/Restrictions fall precautions;posterior hip precautions   Weight-bearing Status   Extremity Weight-bearing Status right lower extremity   Right Lower Extremity weight-bearing as  tolerated (WBAT)   Mutuality/Individual Preferences   Anxieties, Fears or Concerns Concerns about remembering hip precautions and all the exercises to do   Living Environment   Lives With spouse   Living Arrangements house   Home Accessibility stairs within home   Stairs Within Home, Primary   Stair Railings, Within Home, Primary railing on left side (ascending)   Number of Stairs, Within Home, Primary other (see comments)  (thirteen)   Functional Level Prior   Ambulation 1 - assistive equipment   Transferring 0 - independent   Toileting 0 - independent   Bathing 0 - independent   Dressing 0 - independent   Eating 0 - independent   Communication 0 - understands/communicates without difficulty   Swallowing 0-->swallows foods/liquids without difficulty   Pre Treatment Status   Pre Treatment Patient Status Patient supine in bed   Support Present Pre Treatment  Family present   Communication Pre Treatment  Nurse   Communication Pre Treatment Comment Nurse cleared patient to participate in PT evaluation   Cognitive Assessment/Interventions   Behavior/Mood Observations alert;cooperative   Orientation Status oriented x 4   Attention WNL/WFL   Follows Commands WNL   Vital Signs   Pre-Treatment Heart Rate (beats/min) 76   Post-treatment Heart Rate (beats/min) 82   Pre SpO2 (%) 93   O2 Delivery Pre Treatment room air   Post SpO2 (%) 92   O2 Delivery Post Treatment room air   Pain Assessment   Pretreatment Pain Rating 5/10   Posttreatment Pain Rating 3/10   Pre/Posttreatment Pain Comment pain improved with activity and ambulation   RUE Assessment   RUE Assessment WFL- Within Functional Limits   LUE Assessment   LUE Assessment WFL- Within Functional Limits   RLE Assessment   RLE Assessment X-Exceptions   RLE ROM hip 0-80 degrees, knee and ankle WFL   RLE Strength 3-/5 hip, knee 4-/5, ankle 5/5   LLE Assessment   LLE Assessment WFL- Within Functional Limits   Trunk Assessment   Trunk Assessment WFL-Within Functional Limits   Bed  Mobility Assessment/Treatment   Bed Mobility, Assistive Device bed rails   Supine-Sit Independence contact guard assist   Sit to Supine, Independence contact guard assist   Transfer Assessment/Treatment   Sit-Stand Independence contact guard assist   Stand-Sit Independence contact guard assist   Sit-Stand-Sit, Assist Device walker, front wheeled   Gait Assessment/Treatment   Independence  contact guard assist   Assistive Device  walker, front wheeled   Distance in Feet 180   Comment Initiated with 3 point step to pattern but was able to transition to reciprocal gait pattern with heel strike and knee bend with swing through phase with cues.   Post Treatment Status   Post Treatment Patient Status Patient supine in bed   Support Present Post Treatment  Family present   Communication Post Treatement Nurse   Communication Post Treatment Comment Updated nurse on his abilities and performance with PT eval. Let her know he had some mild nausea following activity.  Physical Therapy Clinical Impression   Assessment Patient tolerated PT evaluation well and achieved ambulation goal for distance on DOS. He improved his gait quality as he progressed in distance. He required CGA for all bed mobility, transfers and gait with minimal verbal cues. Prior to activity a detailed discussion about THR dislocation precautions was completed with patient, his wife and 2 other family members. They asked good questions which were answered. He will benefit from further PT services to progress through postop physician protocol including stair training, HEP instruction, further transfer and gait training, strengthening, review of THR precautions and safety.   Criteria for Skilled Therapeutic meets criteria   Impairments Found (describe specific impairments) gait, locomotion, and balance;muscle performance;ROM (range of motion)  (transfers)   Rehab Potential good   Therapy Frequency other (see comments)  (1-3x daily Monday through Saturday)    Predicted Duration of Therapy Intervention (days/wks) until discharge   Anticipated Equipment Needs at Discharge (PT) front wheeled walker;reacher   Anticipated Discharge Disposition home with assist   Evaluation Complexity Justification   Patient History: Co-morbidity/factors that impact Plan of Care 1-2 that impact Plan of Care   Examination Components 1-2 Exam elements addressed   Presentation Stable: Uncomplicated, straight-forward, problem focused   Clinical Decision Making Low complexity   Evaluation Complexity Low complexity   Planned Therapy Interventions, PT Eval   Planned Therapy Interventions (PT) bed mobility training;gait training;home exercise program;patient/family education;ROM (range of motion);stair training;strengthening;transfer training       TOTAL HIP ARTHROPLASTY (THA) GOALS    1) AMBULATE 300 FEET WITH WALKER AND SBA/S.  2) TRANSFER BED TO/FROM CHAIR WITH SBA/S WHILE MAINTAINING THA PRECAUTIONS.Marland Kitchen   3) NEGOTIATES TRAINING STEPS USING HANDRAIL AND SBA/S.       Physical Therapy Inpatient THA D/C Criteria        PATIENT/CAREGIVER EDUCATION  Educated on exercise program? NO  Can successfully demonstrate exercise form? NO  Can demonstrate safe/effective assistance with functional mobility? YES    IMPAIRMENT BASED CRITERIA  Can the patient verbalize THA precautions including no leg crossing, no bending > 90 degrees and no twisting? NO  Does the patient have adequate pain control of <4/10 for functional mobility at home? NO    FUNCTIONAL BASED CRITERIA  Does that patient demonstrate the ability to ambulate 80 feet with RW using CGA/SPV assistance? YES  Does the patient demonstrate the ability to perform bed mobility with no more than min assist? YES  Does the patient demonstrated the ability to sit to stand from the bed or chair with min assist? YES  Does the patient demonstrate the ability to walk up/down 3-5 stairs with min assist as required for home entry? NO             INTERVENTION MINUTES:  EVALUATION 20 minutes, THERAPEUTIC ACTIVITY 25 minutes, and GAIT TRAINING 15 MINUTES    EVALUATION COMPLEXITY : CLINICAL DECISION MAKING OF LOW COMPLEXITY AS INDICATED BY PMH, PHYSICAL THERAPY ASSESSMENT OF MUSCULOSKELETAL AND NEUROLOGICAL SYSTEMS AND ACTIVITY LIMITATIONS. CLINICAL PRESENTATION IS STABLE AND UNCOMPLICATED    Therapist:     Rollene Rotunda, PT  08/13/2021, 17:24

## 2021-08-13 NOTE — Anesthesia Preprocedure Evaluation (Signed)
ANESTHESIA PRE-OP EVALUATION  Planned Procedure: RIGHT TOTAL HIP ARTHROPLASTY USING ACCOLADE IMPLANTS (Right: Hip)  Review of Systems     anesthesia history negative     patient summary reviewed  nursing notes reviewed        Pulmonary   sleep apnea and CPAP,   Cardiovascular    Hypertension, well controlled, ECG reviewed and hyperlipidemia ,No peripheral edema,  Exercise Tolerance: > or = 4 METS        GI/Hepatic/Renal   negative GI/hepatic/renal ROS,         Endo/Other    osteoarthritis,      Neuro/Psych/MS   negative neuro/psych ROS,      Cancer    negative hematology/oncology ROS,                   Physical Assessment      Airway       Mallampati: II    TM distance: >3 FB    Neck ROM: full  Mouth Opening: good.            Dental       Dentition intact             Pulmonary    Breath sounds clear to auscultation  (-) no rhonchi, no decreased breath sounds, no wheezes, no rales and no stridor     Cardiovascular    Rhythm: regular  Rate: Normal  (-) no friction rub, carotid bruit is not present, no peripheral edema and no murmur     Other findings            Plan  ASA 2     Planned anesthesia type: general     general anesthesia with endotracheal tube intubation    plan to administer opioids postoperatively            PONV/POV Plan:  I plan to administer pharmcologic prophalaxis antiemetics  Intravenous induction     Anesthesia issues/risks discussed are: Sore Throat, Dental Injuries and Aspiration.  Anesthetic plan and risks discussed with patient  Signed consent obtained            Patient's NPO status is appropriate for Anesthesia.           Plan discussed with CRNA.

## 2021-08-13 NOTE — Anesthesia Procedure Notes (Signed)
Airway Note  General Information and Staff   Authorizing provider: Cornett, Edgar Stuart, MD  Performing provider: Kasmira Cacioppo, CRNA        Urgency: elective    Airway not difficult    Indications and Patient Condition  Pt location: In Or  Indications for airway management: anesthesia  Spontaneous Ventilation: absent  Sedation level: deep  Preoxygenated: yes  Patient position: sniffing  Mask difficulty assessment: 2 - vent by mask + OA or adjuvant +/- NMBA        Final Airway Details  Final airway type: endotracheal airway        Successful airway: ETT and cuffed     Successful intubation technique: direct laryngoscopy  Facilitating devices/methods: intubating stylet              Endotracheal tube insertion site: oral  Blade: Macintosh  Blade size: #3  Airway size (mm): 7.5  Cormack-Lehane Classification: grade I - full view of glottis  Placement verified by: chest auscultation and capnometry   Marked at 22  Measured from: lips  Secured with: Tape  Number of attempts at approach: 1  Ventilation between attempts: none  Number of other approaches attempted: 0Airway complications: Atraumatic

## 2021-08-13 NOTE — Care Plan (Signed)
Problem: Adult Inpatient Plan of Care  Goal: Plan of Care Review  Outcome: Ongoing (see interventions/notes)  Goal: Patient-Specific Goal (Individualized)  Outcome: Ongoing (see interventions/notes)  Flowsheets (Taken 08/13/2021 1654)  Individualized Care Needs: pt  Anxieties, Fears or Concerns: post operative pain  Patient-Specific Goals (Include Timeframe): to walk again  Goal: Absence of Hospital-Acquired Illness or Injury  Outcome: Ongoing (see interventions/notes)  Goal: Optimal Comfort and Wellbeing  Outcome: Ongoing (see interventions/notes)  Goal: Rounds/Family Conference  Outcome: Ongoing (see interventions/notes)     Problem: Fall Injury Risk  Goal: Absence of Fall and Fall-Related Injury  Outcome: Ongoing (see interventions/notes)     Problem: Pain Acute  Goal: Optimal Pain Control and Function  Outcome: Ongoing (see interventions/notes)     Problem: Infection  Goal: Absence of Infection Signs and Symptoms  Outcome: Ongoing (see interventions/notes)

## 2021-08-13 NOTE — Anesthesia Postprocedure Evaluation (Signed)
Anesthesia Post Op Evaluation    Patient: Travis Brooks  Procedure(s):  RIGHT TOTAL HIP ARTHROPLASTY USING NONCEMENTED ACCOLADE II IMPLANTS    Last Vitals:Temperature: 36.4 C (97.5 F) (08/13/21 1325)  Heart Rate: 90 (08/13/21 1325)  BP (Non-Invasive): 132/71 (08/13/21 1325)  Respiratory Rate: 18 (08/13/21 1325)  SpO2: 100 % (08/13/21 1325)    No notable events documented.    Patient is sufficiently recovered from the effects of anesthesia to participate in the evaluation and has returned to their pre-procedure level.  Patient location during evaluation: PACU       Patient participation: complete - patient participated  Level of consciousness: awake and alert and responsive to verbal stimuli    Pain score: 0  Pain management: adequate  Airway patency: patent    Anesthetic complications: no  Cardiovascular status: acceptable  Respiratory status: acceptable  Hydration status: acceptable  Patient post-procedure temperature: Pt Normothermic   PONV Status: Absent

## 2021-08-13 NOTE — Care Management Notes (Signed)
[facility] Management Initial Evaluation    Patient Name: Travis Brooks  Date of Birth: 1955/11/23  Sex: male  Date/Time of Admission: 08/13/2021  8:10 AM  Room/Bed: 371/A  Payor: AETNA-MEDI-ADV / Plan: AETNA MEDICARE ADVANTAGE PPO / Product Type: PPO /   Primary Care Providers:  Alain Honey, MD, MD (General)    Pharmacy Info:   Preferred Pharmacy       [id number] [place], [place] [id number]   [id number]   Phone: [phone]: [phone]   Hours: Not open 24 hours          Emergency Contact Info:   Extended Emergency Contact Information  Primary Emergency Contact: Tamburri,Georgianna  Address: [address]           [place] [place] [place] Phone: [phone] Relation: Wife    History:   HRODERIC LAMMERTis a 66y.o., male, admitted 08/13/21.    Height/Weight: 170.2 cm (5' 7.01") / 83.9 kg (184 lb 15.5 oz)     LOS: 1 day   Admitting Diagnosis: Status post total hip replacement, right [[O37.290]   Assessment:      08/13/21 1817   Assessment Details   Assessment Type Admission   Date of Care Management Update 08/13/21   Readmission   Is this a readmission? No   Insurance Information/Type   Insurance type Medicare   Employment/Financial   Patient has Prescription Coverage?  Yes        Name of Insurance Coverage for Medications A[employer] Medicare   Financial Concerns none   Living Environment   Select an age group to open "lives with" row.  Adult   Lives With spouse   Living Arrangements house   Able to Return to Prior Arrangements yes   IEP and/or 504 Plan? No   Home Safety   Home Assessment: No Problems Identified   Home Accessibility stairs to enter home;stairs within home   Custody and Legal Status   Do you have a court appointed guardian/conservator? No   Are you an emancipated minor? No   Custody Issues? No   Paternity Affidavit Requested? No   Care Management Plan   Discharge  Planning Status initial meeting   Projected Discharge Date 08/14/21   Discharge plan discussed with: Patient;Spouse   CM will evaluate for rehabilitation potential no   Patient choice offered to patient/family no   Form for patient choice reviewed/signed and on chart no   Discharge Needs Assessment   Equipment Currently Used at Home cane, quad;commode;walker, front wheeled   Equipment Needed After Discharge none   Discharge Facility/Level of Care Needs Home (Patient/Family Member/other)(code 1)   Transportation Available car   Referral Information   Admission Type observation   Arrived From home or self-care   Home Main Entrance   Number of Stairs, Main Entrance three   Stairs Within Home, Primary   Number of Stairs, Within Home, Primary three     CM met with pt and family at bedside. Pt was alert and oriented to all spheres, but did keep falling asleep. Pt lives at home with his wife, [place] and will return there upon discharge. She will be assisting in pt's care. Pt has 3 steps to get into his home and 13 stairs to get upstairs. He can stay on the main level. Pt has a cane, walker,  and toilet seat with rails. Pt will not need outpatient physical therapy.     Discharge Plan:  Home (Patient/Family Member/other) (code 1)      The patient will continue to be evaluated for developing discharge needs.     Case Manager: Doroteo Bradford, [place]  Phone: [phone]

## 2021-08-13 NOTE — OR Surgeon (Signed)
[facility]  Operative Note     PATIENT NAME:  Travis, Brooks  MRN:  [id number]  DOB:  Jan 23, 1956    Date of Procedure:  08/13/2021  Preoperative Diagnosis: END STAGE OSTEOARTHRITIS OF RIGHT HIP   Postoperative Diagnoses:  END STAGE OSTEOARTHRITIS RIGHT HIP   Procedure Performed: Procedure(s) (LRB):  RIGHT TOTAL HIP ARTHROPLASTY USING NONCEMENTED ACCOLADE II IMPLANTS (Right)   Implants:  Implant Name Type Inv. Item Serial No. Manufacturer Lot No. LRB No. Used Action   SHELL ACET 54MM HIP SLD BCK TRIT TRIDENT II E STRL - [id number]  SHELL ACET 54MM HIP SLD BCK TRIT TRIDENT II E STRL  [employer] [id number] Right 1 Implanted   INSERT ACET TRIDENT 36MM 10D E X3 HIP - [id number]  INSERT ACET TRIDENT 36MM 10D E X3 HIP  [employer] [id number] Right 1 Implanted   STEM FEM ACCOLADE V40 108MM PR_FT PUREFIX TI HIP 132D 5 40MM - [id number]  STEM FEM ACCOLADE V40 108MM PR_FT PUREFIX TI HIP 132D 5 40MM  [employer] [id number] Right 1 Implanted   HEAD V40 36MM +0MM OFST TAPER_HIP Lia Foyer FEM STRL - [id number]  HEAD V40 36MM +0MM OFST TAPER_HIP Travis Brooks  [employer] [id number] Right 1 Implanted      Surgeon: Karsten Ro, MD   Anesthesia: Anesthesiologist: Cornett, Exie Parody, MD  CRNA: Judyann Munson, CRNA   OR Staff: Circulator: Cecil Cranker, RN  PERIOPERATIVE CARE ASSISTANT: Avel Peace, CST; Helane Rima, CST  Relief Circulator: Mateo Flow, RN  Scrub Person: Vic Ripper, ST  Scrub First Assist: Vladimir Crofts, ST  Scrub Retractor Assist: Darnelle Maffucci, ST  Anesthesia Tech: Kermit Balo, AA   Antibiotic:  Kefzol  Anticoagulation: ASA   Estimated Blood Loss: * No blood loss amount entered *   Specimens: * No specimens in log *   Complications: None immediate  Indications For Procedure:  Travis Brooks  is a very pleasant  66 y.o. male  presenting  for END STAGE OSTEOARTHRITIS OF RIGHT HIP   Risks of total hip including sciatic nerve injury,  dislocation, infection, DVT, PE, intra-operative fracture, and potential medical complications and implications discussed. Visual merchandiser and alternatives reviewed. Surgical informed consent obtained.  DESCRIPTION OF THE PROCEDURE:  Side and site were identified with consent and marking on patient.  Antibiotics and TXA were administered prior to incision.  Prep and draping were performed in the lateral decubitus position.  An EKG marker was secured to the patella.  Posterolateral approach to hip was completed.  Short external rotators were divided and retracted to protect the sciatic nerve.  The hip was dislocated, and the femoral neck was osteotomized at appropriate length and orientation.  The acetabulum was exposed, reaming performed to achieve stable fixation  of the above sized cup. The acetabulum was impacted in 40/20 orientation.  Depth of seating was verified with the robot and was verified through the central hole.  The hole was capped with the screw in plug.  The polyethylene liner was seated.  Orientation and fixation of the shell and poly was verified.  The femoral side was exposed.  The trochanter was opened with trochanteric gouge.  The femur was reamed and broached to the above noted size.  Trial reduction was performed and found to be stable through fully obtainable range.  Trial reduction was performed, and leg lengths evaluated .  Hip was put through full range of motion with excellent stability.  The hip was  dislocated and provisionals were removed.  The final components were seated.  The femoral head was reduced on to a dry trunnion, and the hip was reduced.  Final testing revealed excellent stability and appropriate soft tissue tension.  The short external rotators were reattached to their trochanteric insertions using #5 ethibond suture.  The wound was closed in layers finishing with subcuticular stitch.  Patient was taken to recovery under the supervision of anesthesia.  Karsten Ro,  MD  This note was partially generated using MModal Fluency Direct system, and there may be some incorrect words, spellings, and punctuation that were not noted in checking the note before saving.

## 2021-08-13 NOTE — OR PreOp (Signed)
INFORMED MISSY IN OR THAT PATIENT DID NOT STOP ASA DR. BRANSON NOTIFIED OK TO PROCEED

## 2021-08-14 LAB — CBC WITH DIFF
BASOPHIL #: 0 10*3/uL (ref 0.00–2.50)
BASOPHIL %: 0 % (ref 0–3)
EOSINOPHIL #: 0 10*3/uL (ref 0.00–2.40)
EOSINOPHIL %: 0 % (ref 0–7)
HCT: 41 % — ABNORMAL LOW (ref 42.0–51.0)
HGB: 15.1 g/dL (ref 13.5–18.0)
LYMPHOCYTE #: 0.7 10*3/uL — ABNORMAL LOW (ref 2.10–11.00)
LYMPHOCYTE %: 4 % — ABNORMAL LOW (ref 25–45)
MCH: 32.9 pg — ABNORMAL HIGH (ref 27.0–32.0)
MCHC: 36.9 g/dL — ABNORMAL HIGH (ref 32.0–36.0)
MCV: 89.2 fL (ref 78.0–99.0)
MONOCYTE #: 0.3 10*3/uL (ref 0.00–4.10)
MONOCYTE %: 2 % (ref 0–12)
MPV: 7.3 fL — ABNORMAL LOW (ref 7.4–10.4)
NEUTROPHIL #: 17.1 10*3/uL (ref 4.10–29.00)
NEUTROPHIL %: 94 % — ABNORMAL HIGH (ref 40–76)
PLATELETS: 199 10*3/uL (ref 140–440)
RBC: 4.59 10*6/uL (ref 4.20–6.00)
RDW: 12.6 % (ref 11.6–14.8)
WBC: 18.2 10*3/uL — ABNORMAL HIGH (ref 4.0–10.5)
WBCS UNCORRECTED: 18.2 10*3/uL

## 2021-08-14 LAB — BASIC METABOLIC PANEL
ANION GAP: 11 mmol/L (ref 10–20)
BUN/CREA RATIO: 20 (ref 6–22)
BUN: 32 mg/dL — ABNORMAL HIGH (ref 7–25)
CALCIUM: 9.1 mg/dL (ref 8.6–10.3)
CHLORIDE: 104 mmol/L (ref 98–107)
CO2 TOTAL: 21 mmol/L (ref 21–31)
CREATININE: 1.58 mg/dL — ABNORMAL HIGH (ref 0.60–1.30)
ESTIMATED GFR: 48 mL/min/{1.73_m2} — ABNORMAL LOW (ref >59)
GLUCOSE: 159 mg/dL — ABNORMAL HIGH (ref 74–109)
OSMOLALITY, CALCULATED: 282 mOsm/kg (ref 270–290)
POTASSIUM: 4.3 mmol/L (ref 3.5–5.1)
SODIUM: 136 mmol/L (ref 136–145)

## 2021-08-14 NOTE — Care Plan (Signed)
[facility]  [address]  [place], [address]  [phone]  (Fax) [phone]  Rehabilitation Department  Physical Therapy Daily Inpatient Note    Date: 08/14/2021  Patient's Name: Travis Brooks  Date of Birth: [birth year]  Height: Height: 170.2 cm (5' 7.01")  Weight: Weight: 83.9 kg (184 lb 15.5 oz)      Plan: Will continue under current POC.         Subjective/Objective/Assessment:  Flowsheet     08/14/21 0914   Rehab Session   Document Type therapy progress note (daily note)   Total PT Minutes: 41   Patient Effort excellent   Symptoms Noted During/After Treatment none   General Information   Patient Profile Reviewed yes   Respiratory Status room air   Existing Precautions/Restrictions posterior hip precautions   Weight-bearing Status   Extremity Weight-bearing Status right lower extremity   Right Lower Extremity weight-bearing as tolerated (WBAT)   Mutuality/Individual Preferences   Individualized Care Needs PT   Pre Treatment Status   Pre Treatment Patient Status Patient sitting in bedside chair or w/c   Support Present Pre Treatment  Family present   Cognitive Assessment/Interventions   Behavior/Mood Observations behavior appropriate to situation, WNL/WFL   Orientation Status oriented x 4   Attention WNL/WFL   Follows Commands WNL   Vital Signs   O2 Delivery Pre Treatment room air   Pain Assessment   Pretreatment Pain Rating 2/10   Posttreatment Pain Rating 2/10   Pain Location - Side Right   Pain Location hip   Transfer Assessment/Treatment   Sit-Stand Independence modified independence   Stand-Sit Independence modified independence   Sit-Stand-Sit, Assist Device walker, front wheeled   Transfer Impairments pain;ROM decreased   Gait Assessment/Treatment   Total Distance Ambulated 320   Independence  stand-by assistance   Assistive Device  walker, front wheeled   Distance in Feet 320   Deviations  stride length decreased   Maintain Weight Bearing Status able to  maintain   Balance Skill Training   Sitting Balance: Static normal balance   Sitting, Dynamic (Balance) normal balance   Sit-to-Stand Balance good balance   Standing Balance: Static good balance   Standing Balance: Dynamic good balance   Systems Impairment Contributing to Balance Disturbance musculoskeletal   Therapeutic Exercise/Activity   Comment group exercises including post op total hip exercises.  all exercises x 20   Post Treatment Status   Post Treatment Patient Status Patient sitting in bedside chair or w/c   Support Present Post Treatment  Family present   Communication Post Treatment Comment Pt walked from his room 320 ft to group therapy room. Walked up/down 4 steps with SBA.  Is able to recall all hip precautions.  Was able to complete all post op total hip exercises without difficutly  He was then walked back to his room and left in chair with all needs in reach, family present, and alarm activated                 Intervention minutes: GROUP THERAPEUTIC EXERCISE 40 MINUTES and GAIT TRAINING [address] MINUTES    THERAPIST  Chazlyn Cude, PTA  08/14/2021, 10:49

## 2021-08-14 NOTE — PT Treatment (Signed)
Physical Therapy Inpatient THA D/C Criteria        PATIENT/CAREGIVER EDUCATION  . Educated on exercise program? YES  . Can successfully demonstrate exercise form? YES  . Can demonstrate safe/effective assistance with functional mobility? YES    IMPAIRMENT BASED CRITERIA  . Can the patient verbalize THA precautions including no leg crossing, no bending > 90 degrees and no twisting? YES  . Does the patient have adequate pain control of <4/10 for functional mobility at home? YES    FUNCTIONAL BASED CRITERIA  . Does that patient demonstrate the ability to ambulate 80 feet with RW using CGA/SPV assistance? YES  . Does the patient demonstrate the ability to perform bed mobility with no more than min assist? YES  . Does the patient demonstrated the ability to sit to stand from the bed or chair with min assist? YES  . Does the patient demonstrate the ability to walk up/down 3-5 stairs with min assist as required for home entry? YES        THERAPIST  Tayvia Faughnan, PTA  08/14/2021, 10:50

## 2021-08-14 NOTE — Discharge Instructions (Signed)
Orthopaedic Discharge Instructions:    Weightbear as tolerated to the operative extremity with use of an assistive device as needed upon ambulation    Follow hip dislocation precautions for the next 6 weeks    Maintain surgical dressing until postoperative day 7.  May remove dressing at this time and leave incision open to air if dry.  If there is persistent drainage please cover with a gauze dressing and change daily. Please call the clinic with any erythema or purulent drainage concerning for infection.    May shower, no tub baths until follow-up     Take pain medication and blood thinners as prescribed    Follow-up in the orthopedics clinic at your scheduled appointment in approximately 2-3 weeks.  Please call the office to confirm your appointment.  Please also call with any questions or concerns.      [facility]  [address], [place], [place] [address]  [phone]

## 2021-08-14 NOTE — Care Plan (Signed)
Problem: Adult Inpatient Plan of Care  Goal: Plan of Care Review  08/14/2021 0315 by Denny Peon, RN  Outcome: Ongoing (see interventions/notes)  08/14/2021 0313 by Denny Peon, RN  Outcome: Ongoing (see interventions/notes)  Goal: Patient-Specific Goal (Individualized)  08/14/2021 0315 by Denny Peon, RN  Outcome: Ongoing (see interventions/notes)  08/14/2021 0313 by Denny Peon, RN  Outcome: Ongoing (see interventions/notes)  Flowsheets (Taken 08/13/2021 1900)  Individualized Care Needs: Physical therapy  Anxieties, Fears or Concerns: postop pain  Patient-Specific Goals (Include Timeframe): patient wants to go back home by 08/14/21  Plan of Care Reviewed With: patient  Goal: Absence of Hospital-Acquired Illness or Injury  08/14/2021 0315 by Denny Peon, RN  Outcome: Ongoing (see interventions/notes)  08/14/2021 0313 by Denny Peon, RN  Outcome: Ongoing (see interventions/notes)  Intervention: Identify and Manage Fall Risk  Flowsheets  Taken 08/14/2021 0315  Safety Promotion/Fall Prevention:   activity supervised   fall prevention program maintained   motion sensor pad activated   nonskid shoes/slippers when out of bed   safety round/check completed  Taken 08/14/2021 0308  Safety Promotion/Fall Prevention:   activity supervised   fall prevention program maintained   safety round/check completed  Taken 08/14/2021 0200  Safety Promotion/Fall Prevention:   activity supervised   fall prevention program maintained   safety round/check completed  Taken 08/14/2021 0100  Safety Promotion/Fall Prevention:   activity supervised   fall prevention program maintained   safety round/check completed  Taken 08/14/2021 0015  Safety Promotion/Fall Prevention:   fall prevention program maintained   safety round/check completed   activity supervised  Taken 08/13/2021 2200  Safety Promotion/Fall Prevention:   fall prevention program maintained   safety round/check completed  Taken 08/13/2021 2100  Safety Promotion/Fall  Prevention:   fall prevention program maintained   safety round/check completed  Taken 08/13/2021 2000  Safety Promotion/Fall Prevention:   fall prevention program maintained   safety round/check completed  Taken 08/13/2021 1900  Safety Promotion/Fall Prevention:   activity supervised   fall prevention program maintained   safety round/check completed  Intervention: Prevent Skin Injury  Flowsheets  Taken 08/14/2021 0315  Body Position:   weight shift assistance provided   positioned with 1 assist  Taken 08/13/2021 1900  Skin Protection: adhesive use limited  Intervention: Prevent and Manage VTE (Venous Thromboembolism) Risk  Flowsheets  Taken 08/14/2021 0315  VTE Prevention/Management:   ambulation promoted   foot pump device on  Taken 08/13/2021 1900  VTE Prevention/Management: foot pump device on  Intervention: Prevent Infection  Flowsheets  Taken 08/14/2021 0315  Infection Prevention:   promote handwashing   rest/sleep promoted   single patient room provided  Taken 08/14/2021 0308  Infection Prevention:   promote handwashing   rest/sleep promoted  Taken 08/14/2021 0200  Infection Prevention:   promote handwashing   rest/sleep promoted  Taken 08/14/2021 0100  Infection Prevention:   promote handwashing   rest/sleep promoted  Taken 08/14/2021 0015  Infection Prevention:   promote handwashing   rest/sleep promoted  Taken 08/13/2021 2200  Infection Prevention:   promote handwashing   rest/sleep promoted   visitors restricted/screened  Taken 08/13/2021 2100  Infection Prevention:   promote handwashing   rest/sleep promoted  Taken 08/13/2021 2000  Infection Prevention:   promote handwashing   rest/sleep promoted   single patient room provided  Taken 08/13/2021 1900  Infection Prevention:   promote handwashing   rest/sleep promoted  Goal: Optimal Comfort and Wellbeing  08/14/2021 0315 by Denny Peon, RN  Outcome: Ongoing (see interventions/notes)  08/14/2021 0313 by Denny Peon, RN  Outcome: Ongoing (see  interventions/notes)  Intervention: Monitor Pain and Promote Comfort  Flowsheets (Taken 08/14/2021 0315)  Pain Management Interventions:   pain management plan reviewed with patient/caregiver   pain medication given  Intervention: Provide Person-Centered Care  Flowsheets  Taken 08/14/2021 0315  Trust Relationship/Rapport:   care explained   reassurance provided   choices provided   thoughts/feelings acknowledged   emotional support provided   empathic listening provided   questions answered   questions encouraged  Taken 08/13/2021 1900  Trust Relationship/Rapport:   care explained   choices provided   emotional support provided   empathic listening provided   questions answered   questions encouraged   reassurance provided   thoughts/feelings acknowledged  Goal: Rounds/Family Conference  08/14/2021 0315 by Denny Peon, RN  Outcome: Ongoing (see interventions/notes)  08/14/2021 0313 by Denny Peon, RN  Outcome: Ongoing (see interventions/notes)     Problem: Fall Injury Risk  Goal: Absence of Fall and Fall-Related Injury  08/14/2021 0315 by Denny Peon, RN  Outcome: Ongoing (see interventions/notes)  08/14/2021 0313 by Denny Peon, RN  Outcome: Ongoing (see interventions/notes)  Intervention: Identify and Manage Contributors  Flowsheets  Taken 08/14/2021 0315  Self-Care Promotion:   independence encouraged   BADL personal objects within reach  Medication Review/Management:   medications reviewed   high-risk medications identified  Taken 08/13/2021 1900  Self-Care Promotion:   independence encouraged   BADL personal objects within reach  Medication Review/Management: medications reviewed  Intervention: Promote Injury-Free Environment  Flowsheets  Taken 08/14/2021 0315  Safety Promotion/Fall Prevention:   activity supervised   fall prevention program maintained   motion sensor pad activated   nonskid shoes/slippers when out of bed   safety round/check completed  Taken 08/14/2021 0308  Safety Promotion/Fall  Prevention:   activity supervised   fall prevention program maintained   safety round/check completed  Taken 08/14/2021 0200  Safety Promotion/Fall Prevention:   activity supervised   fall prevention program maintained   safety round/check completed  Taken 08/14/2021 0100  Safety Promotion/Fall Prevention:   activity supervised   fall prevention program maintained   safety round/check completed  Taken 08/14/2021 0015  Safety Promotion/Fall Prevention:   fall prevention program maintained   safety round/check completed   activity supervised  Taken 08/13/2021 2200  Safety Promotion/Fall Prevention:   fall prevention program maintained   safety round/check completed  Taken 08/13/2021 2100  Safety Promotion/Fall Prevention:   fall prevention program maintained   safety round/check completed  Taken 08/13/2021 2000  Safety Promotion/Fall Prevention:   fall prevention program maintained   safety round/check completed  Taken 08/13/2021 1900  Safety Promotion/Fall Prevention:   activity supervised   fall prevention program maintained   safety round/check completed     Problem: Pain Acute  Goal: Optimal Pain Control and Function  08/14/2021 0315 by Denny Peon, RN  Outcome: Ongoing (see interventions/notes)  08/14/2021 0313 by Denny Peon, RN  Outcome: Ongoing (see interventions/notes)  Intervention: Develop Pain Management Plan  Flowsheets (Taken 08/14/2021 0315)  Pain Management Interventions:   pain management plan reviewed with patient/caregiver   pain medication given  Intervention: Prevent or Manage Pain  Flowsheets  Taken 08/14/2021 0315  Bowel Elimination Promotion:   adequate fluid intake promoted   ambulation promoted   commode/bedpan at bedside  Medication Review/Management:   medications reviewed   high-risk medications identified  Taken 08/13/2021 1900  Sensory Stimulation Regulation: quiet environment  promoted  Sleep/Rest Enhancement: awakenings minimized  Bowel Elimination Promotion:   adequate fluid intake  promoted   ambulation promoted   commode/bedpan at bedside  Medication Review/Management: medications reviewed  Intervention: Optimize Psychosocial Wellbeing  Flowsheets (Taken 08/14/2021 0315)  Supportive Measures: active listening utilized     Problem: Infection  Goal: Absence of Infection Signs and Symptoms  08/14/2021 0315 by Denny Peon, RN  Outcome: Ongoing (see interventions/notes)  08/14/2021 0313 by Denny Peon, RN  Outcome: Ongoing (see interventions/notes)  Intervention: Prevent or Manage Infection  Flowsheets  Taken 08/14/2021 0315  Fever Reduction/Comfort Measures:   lightweight bedding   lightweight clothing  Infection Management: aseptic technique maintained  Taken 08/13/2021 1900  Infection Management: aseptic technique maintained     Problem: Hip Arthroplasty  Goal: Optimal Coping  Outcome: Ongoing (see interventions/notes)  Intervention: Support Psychosocial Response to Surgery and Mobility Changes  Flowsheets (Taken 08/14/2021 0315)  Supportive Measures: active listening utilized  Goal: Absence of Bleeding  Outcome: Ongoing (see interventions/notes)  Intervention: Monitor and Manage Bleeding  Flowsheets  Taken 08/14/2021 0315  Bleeding Management: dressing monitored  Taken 08/13/2021 1900  Bleeding Management: dressing monitored  Goal: Effective Bowel Elimination  Outcome: Ongoing (see interventions/notes)  Intervention: Enhance Bowel Motility and Elimination  Flowsheets (Taken 08/14/2021 0315)  Bowel Elimination Management:   toileting offered   sitting position facilitated   relaxation techniques promoted  Goal: Fluid and Electrolyte Balance  Outcome: Ongoing (see interventions/notes)  Intervention: Monitor and Manage Fluid and Electrolyte Balance  Flowsheets (Taken 08/14/2021 0315)  Fluid/Electrolyte Management: fluids provided  Goal: Optimal Functional Ability  Outcome: Ongoing (see interventions/notes)  Intervention: Promote Optimal Functional Status  Flowsheets  Taken 08/14/2021 0315  Self-Care  Promotion:   independence encouraged   BADL personal objects within reach  Activity Management: activity adjusted per tolerance  Taken 08/13/2021 1900  Self-Care Promotion:   independence encouraged   BADL personal objects within reach  Activity Management: activity adjusted per tolerance  Assistive Device Utilized:   walker   gait belt  Goal: Absence of Infection Signs and Symptoms  Outcome: Ongoing (see interventions/notes)  Intervention: Prevent or Manage Infection  Flowsheets  Taken 08/14/2021 0315  Fever Reduction/Comfort Measures:   lightweight bedding   lightweight clothing  Infection Management: aseptic technique maintained  Taken 08/13/2021 1900  Infection Management: aseptic technique maintained  Goal: Intact Neurovascular Status  Outcome: Ongoing (see interventions/notes)  Intervention: Prevent or Manage Neurovascular Compromise  Flowsheets (Taken 08/14/2021 0315)  Compartment Syndrome Management: active flexion/extension encouraged  Compartment Syndrome Surveillance: no pain with passive muscle stretch  Goal: Anesthesia/Sedation Recovery  Outcome: Ongoing (see interventions/notes)  Intervention: Optimize Anesthesia Recovery  Flowsheets  Taken 08/14/2021 0315  Safety Promotion/Fall Prevention:   activity supervised   fall prevention program maintained   motion sensor pad activated   nonskid shoes/slippers when out of bed   safety round/check completed  Reorientation Measures:   calendar in view   clock in view  Taken 08/14/2021 0308  Safety Promotion/Fall Prevention:   activity supervised   fall prevention program maintained   safety round/check completed  Taken 08/14/2021 0200  Safety Promotion/Fall Prevention:   activity supervised   fall prevention program maintained   safety round/check completed  Taken 08/14/2021 0100  Safety Promotion/Fall Prevention:   activity supervised   fall prevention program maintained   safety round/check completed  Taken 08/14/2021 0015  Safety Promotion/Fall Prevention:   fall  prevention program maintained   safety round/check completed   activity supervised  Taken 08/13/2021 2200  Safety Promotion/Fall Prevention:   fall prevention program maintained   safety round/check completed  Taken 08/13/2021 2100  Safety Promotion/Fall Prevention:   fall prevention program maintained   safety round/check completed  Taken 08/13/2021 2000  Safety Promotion/Fall Prevention:   fall prevention program maintained   safety round/check completed  Taken 08/13/2021 1900  Safety Promotion/Fall Prevention:   activity supervised   fall prevention program maintained   safety round/check completed  Goal: Acceptable Pain Control  Outcome: Ongoing (see interventions/notes)  Intervention: Prevent or Manage Pain  Flowsheets (Taken 08/14/2021 0315)  Pain Management Interventions:   pain management plan reviewed with patient/caregiver   pain medication given  Goal: Nausea and Vomiting Relief  Outcome: Ongoing (see interventions/notes)  Intervention: Prevent or Manage Nausea and Vomiting  Recent Flowsheet Documentation  Taken 08/13/2021 1900 by Denny Peon, RN  Nausea/Vomiting Interventions:   sips of clear liquids given   nausea triggers minimized  Goal: Effective Urinary Elimination  Outcome: Ongoing (see interventions/notes)  Intervention: Monitor and Manage Urinary Retention  Flowsheets  Taken 08/14/2021 0315  Urinary Elimination Promotion:   toileting device within reach   toileting offered   voiding relaxation promoted  Taken 08/13/2021 1900  Urinary Elimination Promotion:   toileting offered   frequent voiding encouraged   voiding relaxation promoted  Goal: Effective Oxygenation and Ventilation  Outcome: Ongoing (see interventions/notes)  Intervention: Optimize Oxygenation and Ventilation  Recent Flowsheet Documentation  Taken 08/13/2021 1900 by Denny Peon, RN  Airway/Ventilation Management: airway patency maintained

## 2021-08-14 NOTE — Nurses Notes (Signed)
Spoke with patient and wife regarding DC paperwork, IV was removed from patient. DC packet given and patient helped into vehicle via wheelchair by RN. Nozin sent with patient, all questions answered.

## 2021-08-14 NOTE — Discharge Summary (Signed)
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      Details   HYDROcodone-acetaminophen 7.5-325 mg Tablet  Commonly known as: NORCO   1-2 Tablets, Oral, EVERY 6 HOURS PRN  Qty: 35 Tablet  Refills: 0        CONTINUE these medications - NO CHANGES were made during your visit.      Details   amLODIPine 5 mg Tablet  Commonly known as: NORVASC   5 mg, Oral, DAILY  Refills: 0     aspirin 81 mg Tablet, Delayed Release (E.C.)  Commonly known as: ECOTRIN   81 mg, Oral, DAILY  Refills: 0     Centrum Silver 0.4 mg-300 mcg- 250 mcg Tablet  Generic drug: MV with Min-Lycopene-Lutein   1 Tablet, Oral, DAILY  Refills: 0     ketoconazole 2 % Shampoo  Commonly known as: NIZORAL   1 Application, Topical, DAILY PRN, Apply to face once daily   Refills: 0     losartan 50 mg Tablet  Commonly known as: COZAAR   50 mg, Oral, DAILY  Refills: 0     magnesium oxide 400 mg Tablet  Commonly known as: MAG-OX   400 mg, Oral, EVERY EVENING  Refills: 0     metoprolol succinate 50 mg Tablet Sustained Release 24 hr  Commonly known as: TOPROL-XL   50 mg, Oral, DAILY  Refills: 0     potassium chloride 20 mEq Tab Sust.Rel. Particle/Crystal  Commonly known as: K-DUR   20 mEq, Oral, DAILY  Refills: 0     Refresh 1.4-0.6 % Dropperette  Generic drug: polyvinyl alcohol-povidone   1 Drop, Both Eyes, 2 TIMES DAILY  Refills: 0     rosuvastatin 10 mg Tablet  Commonly known as: CRESTOR   10 mg, Oral, DAILY  Refills: 0            DISCHARGE INSTRUCTIONS:     Weight bear as tolerated to the operative  extremity with use of an assistive device as needed upon ambulation    Maintain surgical dressing until postoperative day 7.  May remove dressing at this time and leave incision open to air if dry.  If there is persistent drainage please cover with a gauze dressing and change daily. Please call the clinic with any erythema or purulent drainage concerning for infection.    May shower, no tub baths until follow-up     Take pain medication and blood thinners as prescribed. Attend physical therapy as instructed.    Follow-up in the orthopedics clinic at your scheduled appointment in approximately 2-3 weeks.  Please call the office to confirm your appointment.  Please also call with any questions or concerns.    REASON FOR HOSPITALIZATION AND HOSPITAL COURSE:      Travis Brooks is a 66 y.o.male that I have been following as an outpatient secondary to right hip arthritis.   he went on to  fail conservative treatment of their arthritic joint and elected to proceed with a total joint replacement.  he gained preop medical clearance.  We instructed him on the risk and benefits of the surgery, reviewed the consent in its entirety, we reviewed the hospital course including interoperative and postoperative goals.  he arrived on date of procedure.  his  interoperative procedure was uncomplicated.  Patient was admitted postoperatively and remained stable through postoperative course.  Placed on appropriate DVT prophylaxis. They progressed well with physical therapy and will be discharged to home.  Patient will need a walker for initial ambulation to assist with correcting gait form and function.  We have educated the patient on the discharge process,  reviewed med reconciliation, and once again reviewed postoperative goals.  Patient was educated on dressing instructions and care.  We have reminded the patient through education the importance of a healthy diet during the perioperative period.  Questions were encouraged and  answered.        RADIOLOGY:           CONSULTATIONS:      None     PROCEDURES PERFORMED:      Right Total hip Replacement    CONDITION ON DISCHARGE:     Stable    DISCHARGE DISPOSITION:      Home    FOLLOW UP VISITS:    - Dr. Marcelino Scot in 2-3 weeks at scheduled appointment; call [facility] to confirm or with questions/concerns.  - It is recommended you follow up with your PCP within 2 weeks to review any medical changes made during this hospitalization.    Rebeca Alert, D.O.  08/14/21 09:49  [place]  Please call with any questions/concerns - [phone]    This note has been created with voice recognition software.  Please excuse any errors in transcription.  Occasional wrong word or sound alike substitutions may have occurred due to the inherent limitations of voice recognition software.  Please read the chart carefully and recognize using context with the substitutions may have occurred.

## 2022-06-14 ENCOUNTER — Other Ambulatory Visit: Payer: Self-pay

## 2022-06-14 ENCOUNTER — Ambulatory Visit (INDEPENDENT_AMBULATORY_CARE_PROVIDER_SITE_OTHER): Payer: Medicare PPO | Admitting: Surgery

## 2022-06-14 ENCOUNTER — Encounter (INDEPENDENT_AMBULATORY_CARE_PROVIDER_SITE_OTHER): Payer: Self-pay | Admitting: Surgery

## 2022-06-14 VITALS — BP 152/85 | HR 88 | Temp 97.9°F | Resp 18 | Ht 68.0 in | Wt 181.0 lb

## 2022-06-14 DIAGNOSIS — R194 Change in bowel habit: Secondary | ICD-10-CM

## 2022-06-14 MED ORDER — SUTAB 1.479-0.188-0.225 GRAM TABLET
ORAL_TABLET | ORAL | 0 refills | Status: DC
Start: 2022-06-14 — End: 2022-07-22

## 2022-06-14 NOTE — Progress Notes (Signed)
GENERAL SURGERY, [facility]  [address]  [place] [address]    History and Physical     Name: Travis Brooks MRN:  [id number]   Date: 06/14/2022 Age: 67 y.o.            Reason for Visit: change in bowel habits    History of Present Illness  Travis Brooks presents today for changes in bowel habits.    Negative diabetes, blood thinner, family history      Review of the result(s) of each unique test:  Patient underwent diagnostic testing ( none ) prior to this dates visit.  I have personally reviewed the results and that serves as a component of the medical decision making for this encounter       Review of prior external note(s) from each unique source:  Patients referral to this office including a recent assessment by the referring provider.  This was reviewed by me for this unique office visit for the indication and intent of the referral as well as any pertinent medical or surgical history relevant to the patients independent evaluation by me today.      Patient History  Past Medical History:   Diagnosis Date    Hypertension     Osteoarthritis          Past Surgical History:   Procedure Laterality Date    COLON SURGERY      HX BACK SURGERY      HX HERNIA REPAIR           Current Outpatient Medications   Medication Sig    amLODIPine (NORVASC) 5 mg Oral Tablet Take 1 Tablet (5 mg total) by mouth Once a day    aspirin (ECOTRIN) 81 mg Oral Tablet, Delayed Release (E.C.) Take 1 Tablet (81 mg total) by mouth Once a day    ketoconazole (NIZORAL) 2 % Shampoo Apply 1 Application topically Once per day as needed Apply to face once daily    losartan (COZAAR) 50 mg Oral Tablet Take 1 Tablet (50 mg total) by mouth Once a day    magnesium oxide (MAG-OX) 400 mg Oral Tablet Take 1 Tablet (400 mg total) by mouth Every evening    metoprolol succinate (TOPROL-XL) 50 mg Oral Tablet Sustained Release 24 hr Take 1 Tablet (50 mg total) by mouth Once a day    MV with Min-Lycopene-Lutein (CENTRUM  SILVER) 0.4 mg-300 mcg- 250 mcg Oral Tablet Take 1 Tablet by mouth Once a day    polyvinyl alcohol-povidone (REFRESH) 1.4-0.6 % Ophthalmic Dropperette Instill 1 Drop into both eyes Twice daily    potassium chloride (K-DUR) 20 mEq Oral Tab Sust.Rel. Particle/Crystal Take 1 Tablet (20 mEq total) by mouth Once a day    rosuvastatin (CRESTOR) 10 mg Oral Tablet Take 1 Tablet (10 mg total) by mouth Once a day    sod sulf-pot chloride-mag sulf (SUTAB) 1.479-0.188- 0.225 gram Oral Tablet Take 12 tablets with specified amount of water the evening prior to colonoscopy, as directed on the package. Take an additional 12 tablets with specified amount of water the morning of the colonoscopy, as directed on the package. Complete all tablets and required water at least 2 hours prior to procedure.     No Known Allergies  Family Medical History:    None         Social History     Tobacco Use    Smoking status: Never    Smokeless tobacco: Never  Vaping Use    Vaping Use: Never used   Substance Use Topics    Alcohol use: Never    Drug use: Never            Physical Examination:  Vitals:    06/14/22 1458   BP: (!) 152/85   Pulse: 88   Resp: 18   Temp: 36.6 C (97.9 F)   SpO2: 97%   Weight: 82.1 kg (181 lb)   Height: 1.727 m (5\' 8" )   BMI: 27.58        General: appropriate for age. in no acute distress.    Vital signs are present above and have been reviewed by me     HEENT: Atraumatic, Normocephalic. PERRLA. EOMI. Nose clear. Throat clear    Lungs: Nonlabored breathing with symmetric expansion. Clear to auscultation bilaterally    Heart:Regular wth respect to rate and rythmn.    Abdomen:Soft. Nontender. Nondistended and benign    Extremities: Grossly normal. No major deformities     Neuro:  Grossly normal motor and sensory function    Psychiatric: Alert and oriented to person, place, and time. affect appropriate      Assessment and Plan  Colonoscopy scheduled for 07/22/2022 at 7:00 a.m.     Discussed indications, risks, and benefits  of colonoscopy with the patient.  Discussed the possibility of polypectomy, biopsies, and possible repeat examinations.  Risks include bleeding, sedation risks, possibility of missed diagnosis of polyp or malignancy, and remote possibilities of perforation and death.  All questions were answered, and informed consent was clearly obtained.  Follow Up:  No follow-ups on file.      ICD-10-CM    1. Change in bowel habits  R19.4           Koran Seabrook B Nyela Cortinas, MD ,MBA,FACS    I appreciate the opportunity to be involved in the care of your patients.  If you have any questions or concerns regarding this encounter, please do not hesitate to contact me at your convenience.      This note may have been partially generated using MModal Fluency Direct system, and there may be some incorrect words, spellings, and punctuation that were not noted in checking the note before saving, though effort was made to avoid such errors.

## 2022-07-22 ENCOUNTER — Ambulatory Visit (HOSPITAL_COMMUNITY): Payer: Medicare PPO | Admitting: Certified Registered"

## 2022-07-22 ENCOUNTER — Inpatient Hospital Stay
Admission: RE | Admit: 2022-07-22 | Discharge: 2022-07-22 | Disposition: A | Discharge status: Home / Self Care | Payer: Medicare PPO | Source: Ambulatory Visit | Attending: Surgery | Admitting: Surgery

## 2022-07-22 ENCOUNTER — Encounter (HOSPITAL_COMMUNITY): Payer: Self-pay | Admitting: Surgery

## 2022-07-22 ENCOUNTER — Other Ambulatory Visit: Payer: Self-pay

## 2022-07-22 ENCOUNTER — Encounter (HOSPITAL_COMMUNITY)
Admission: RE | Disposition: A | Discharge status: Home / Self Care | Payer: Self-pay | Source: Ambulatory Visit | Attending: Surgery

## 2022-07-22 ENCOUNTER — Encounter (HOSPITAL_COMMUNITY): Payer: Medicare PPO | Admitting: Surgery

## 2022-07-22 DIAGNOSIS — G4730 Sleep apnea, unspecified: Secondary | ICD-10-CM | POA: Insufficient documentation

## 2022-07-22 DIAGNOSIS — E785 Hyperlipidemia, unspecified: Secondary | ICD-10-CM | POA: Insufficient documentation

## 2022-07-22 DIAGNOSIS — Z980 Intestinal bypass and anastomosis status: Secondary | ICD-10-CM | POA: Insufficient documentation

## 2022-07-22 DIAGNOSIS — K5730 Diverticulosis of large intestine without perforation or abscess without bleeding: Secondary | ICD-10-CM

## 2022-07-22 DIAGNOSIS — R194 Change in bowel habit: Secondary | ICD-10-CM | POA: Insufficient documentation

## 2022-07-22 DIAGNOSIS — I10 Essential (primary) hypertension: Secondary | ICD-10-CM | POA: Insufficient documentation

## 2022-07-22 DIAGNOSIS — M1990 Unspecified osteoarthritis, unspecified site: Secondary | ICD-10-CM | POA: Insufficient documentation

## 2022-07-22 HISTORY — DX: Diverticulitis of intestine, part unspecified, without perforation or abscess without bleeding: K57.92

## 2022-07-22 SURGERY — COLONOSCOPY
Anesthesia: General | Wound class: Clean Contaminated Wounds-The respiratory, GI, Genital, or urinary

## 2022-07-22 MED ORDER — PROPOFOL 10 MG/ML IV BOLUS
INJECTION | Freq: Once | INTRAVENOUS | Status: DC | PRN
Start: 2022-07-22 — End: 2022-07-22
  Administered 2022-07-22: 100 mg via INTRAVENOUS
  Administered 2022-07-22: 60 mg via INTRAVENOUS

## 2022-07-22 MED ORDER — DEXTROSE 5 % AND LACTATED RINGERS INTRAVENOUS SOLUTION
INTRAVENOUS | Status: DC | PRN
Start: 2022-07-22 — End: 2022-07-22
  Administered 2022-07-22: 0 via INTRAVENOUS

## 2022-07-22 MED ORDER — LIDOCAINE (PF) 100 MG/5 ML (2 %) INTRAVENOUS SYRINGE
INJECTION | Freq: Once | INTRAVENOUS | Status: DC | PRN
Start: 2022-07-22 — End: 2022-07-22
  Administered 2022-07-22: 100 mg via INTRAVENOUS

## 2022-07-22 NOTE — H&P (Signed)
[facility]  General Surgery  History and Physical    Date of Service:  07/22/2022  Travis Brooks, Travis Brooks, 67 y.o. male  Date of Admission:  07/22/2022  Date of Birth:  03-Jan-1956  PCP: Alain Honey, MD    Reason for admission:  Colonoscopy    HPI:  Travis Brooks is a 67 y.o. White male who is admitted for [place]     Travis Brooks for changes in bowel habits.     Negative diabetes, blood thinner, family history        Review of the result(s) of each unique test:  Patient underwent diagnostic testing ( none ) prior to this dates visit.  I have personally reviewed the results and that serves as a component of the medical decision making for this encounter        Review of prior external note(s) from each unique source:  Patients referral to this office including a recent assessment by the referring provider.  This was reviewed by me for this unique office visit for the indication and intent of the referral as well as any pertinent medical or surgical history relevant to the patients independent evaluation by me Brooks.    Past Medical History:   Diagnosis Date    Diverticulitis     Hypertension     Osteoarthritis       Past Surgical History:   Procedure Laterality Date    COLON SURGERY      HX BACK SURGERY      HX HERNIA REPAIR      HX HIP REPLACEMENT Right       Social History     Tobacco Use    Smoking status: Never    Smokeless tobacco: Never   Vaping Use    Vaping status: Never Used   Substance Use Topics    Alcohol use: Never    Drug use: Never       Family Medical History:    None        Medications Prior to Admission       Prescriptions    amLODIPine (NORVASC) 5 mg Oral Tablet    Take 1 Tablet (5 mg total) by mouth Once a day    aspirin (ECOTRIN) 81 mg Oral Tablet, Delayed Release (E.C.)    Take 1 Tablet (81 mg total) by mouth Once a day    ketoconazole (NIZORAL) 2 % Shampoo    Apply 1 Application topically Once per day as needed Apply to face once daily     losartan (COZAAR) 50 mg Oral Tablet    Take 1 Tablet (50 mg total) by mouth Once a day    magnesium oxide (MAG-OX) 400 mg Oral Tablet    Take 1 Tablet (400 mg total) by mouth Every evening    metoprolol succinate (TOPROL-XL) 50 mg Oral Tablet Sustained Release 24 hr    Take 1 Tablet (50 mg total) by mouth Once a day    MV with Min-Lycopene-Lutein (CENTRUM SILVER) 0.4 mg-300 mcg- 250 mcg Oral Tablet    Take 1 Tablet by mouth Once a day    polyvinyl alcohol-povidone (REFRESH) 1.4-0.6 % Ophthalmic Dropperette    Instill 1 Drop into both eyes Twice daily    potassium chloride (K-DUR) 20 mEq Oral Tab Sust.Rel. Particle/Crystal    Take 1 Tablet (20 mEq total) by mouth Once a day    rosuvastatin (CRESTOR) 10 mg Oral Tablet    Take 1  Tablet (10 mg total) by mouth Once a day           No Known Allergies       Patient Vitals for the past 24 hrs:   BP Temp Pulse Resp SpO2 Height Weight   07/22/22 0652 135/77 36.8 C (98.2 F) 63 15 98 % 1.727 m ('5\' 8"'$ ) 79.4 kg (175 lb)          General: appropriate for age. in no acute distress.    Vital signs are present above and have been reviewed by me     HEENT: Atraumatic, Normocephalic. PERRLA, EOMI. Nose clear. Throat clear.    Lungs: Nonlabored breathing with symmetric expansion.  Clear to auscultation bilaterally    Heart:Regular wth respect to rate and rythmn.    Abdomen:Soft. Nontender. Nondistended and benign    Extremities:  Grossly normal with good range of motion and no major deformities.    Neuro:  Grossly normal motor and sensory function. CN's II through XII intact.    Psychiatric: Alert and oriented to person, place, and time. affect appropriate    Laboratory Data:     No results found for any visits on 07/22/22 (from the past 24 hour(s)).    Imaging Studies:    No orders to display        Assessment/Plan:  CHANGE IN BOWEL HABITS    Colonoscopy scheduled for Friday July 22, 2022    This note was partially created using voice recognition software and is inherently subject to  errors including those of syntax and "sound alike " substitutions which may escape proof reading. In such instances, original meaning may be extrapolated by contextual derivation.    Willow Ora, MD, MBA, FACS

## 2022-07-22 NOTE — OR Surgeon (Signed)
[facility]      Patient Name: Travis Brooks, Travis Brooks Number: [id number]  Date of Service: 07/22/2022   Date of Birth: 02/07/1956      Pre-Operative Diagnosis: CHANGE IN BOWEL HABITS     Post-Operative Diagnosis: Normal Anastomosis  Diverticulosis    Procedure(s)/Description:  COLONOSCOPY: [id number] (CPT)     Attending Surgeon: Willow Ora, MD     Anesthesia:  CRNA: Ardelle Anton, CRNA    Anesthesia Type: .General       The patient indicates that they have read and understood the preoperative colonoscopy consent form. The benefits, risks and alternatives to the procedure were discussed. I specifically discussed the risk of bleeding and/or perforation requiring operation.The patient indicates they have no further question and wish to proceed. Informed consent was obtained from the patient and/or medical power of attorney.    The patient was brought into the procedure room and placed on the table in the left lateral decubitus position. After IV sedation was given, full finger digital rectal examination was performed with a circumferential sweep of the distal rectal mucosa. Subsequently, the flexible colonoscope was inserted into the rectum and passed without any difficulty. The colonoscope was then advanced up into the area of previous distal anastomosis, descending colon, transverse colon, right colon and cecum without any difficulty. Gross examination of each section of the colon was performed.  The patient was noted to have some scattered diverticulosis just distal to the area of previous anastomosis.  Cecal intubation was achieved and the appendiceal orifice and ileocecal valve were identified. The operative findings of diverticulosis were noted as described above. The colonoscope was withdrawn carefully examining the mucosa as the scope was being extracted with particular attention paid to the proximal sides of folds, flexures, bends and rectal valves. At approximately 10 cm. from the  anal verge, the colonoscope was retroflexed to fully examine the distal rectum. The colonoscope was removed and a repeat digital rectal examination was performed at the completion of the procedure. The patient tolerated the procedure well. No intraoperative complications were encountered.    EKG, pulse, pulse oximetry and blood pressure were monitored throughout the entire procedure.    There were no unplanned events.    The patient was instructed to contact me if they have any problems with their colon such as bleeding, pain or changes in bowel habits. They understood and agreed to do so.    The patient will not need another screening colonoscopy for 10 years which is according to ASGE guidelines. However, if in the future the patient has any problems with abdominal pain, changes in bowel habits, blood in stool, etc., then they should contact me because they may be a candidate for diagnostic colonoscopy before the 10 year time limit.      Ashaunte Standley B. Mandolin Falwell, MD, MBA, [facility]

## 2022-07-22 NOTE — Anesthesia Postprocedure Evaluation (Signed)
Anesthesia Post Op Evaluation    Patient: Travis Brooks  Procedure(s):  COLONOSCOPY    Last Vitals:Temperature: 36.8 C (98.2 F) (07/22/22 0652)  Heart Rate: 63 (07/22/22 0652)  BP (Non-Invasive): 135/77 (07/22/22 [id number])  Respiratory Rate: 15 (07/22/22 0652)  SpO2: 98 % (07/22/22 0652)    No notable events documented.    Patient is sufficiently recovered from the effects of anesthesia to participate in the evaluation and has returned to their pre-procedure level.  Patient location during evaluation: PACU       Patient participation: complete - patient participated  Level of consciousness: awake and alert and responsive to verbal stimuli    Pain management: adequate  Airway patency: patent    Anesthetic complications: no  Cardiovascular status: acceptable  Respiratory status: acceptable  Hydration status: acceptable  Patient post-procedure temperature: Pt Normothermic   PONV Status: Absent

## 2022-07-22 NOTE — Discharge Instructions (Addendum)
SURGICAL DISCHARGE INSTRUCTIONS     Dr. Renette Butters, Gene B, MD  performed your COLONOSCOPY today at the [facility]:  Monday through Friday from 8 a.m. - 4 p.m.: [phone]    For T&D: [phone]  Between 4 p.m. - 8 a.m., weekends and holidays:  Call ER [phone]    PLEASE SEE WRITTEN HANDOUTS AS DISCUSSED BY YOUR NURSE:  Apolonio Schneiders, RN    SIGNS AND SYMPTOMS OF A WOUND / INCISION INFECTION   Be sure to watch for the following:  Increase in redness or red streaks near or around the wound or incision.  Increase in pain that is intense or severe and cannot be relieved by the pain medication that your doctor has given you.  Increase in swelling that cannot be relieved by elevation of a body part, or by applying ice, if permitted.  Increase in drainage, or if yellow / green in color and smells bad. This could be on a dressing or a cast.  Increase in fever for longer than 24 hours, or an increase that is higher than 101 degrees Fahrenheit (normal body temperature is 98 degrees Fahrenheit). The incision may feel warm to the touch.    **CALL YOUR DOCTOR IF ONE OR MORE OF THESE SIGNS / SYMPTOMS SHOULD OCCUR.    ANESTHESIA INFORMATION   ANESTHESIA -- ADULT PATIENTS:  You have received intravenous sedation / general anesthesia, and you may feel drowsy and light-headed for several hours. You may even experience some forgetfulness of the procedure. DO NOT DRIVE A MOTOR VEHICLE or perform any activity requiring complete alertness or coordination until you feel fully awake in about 24-48 hours. Do not drink alcoholic beverages for at least 24 hours. Do not stay alone, you must have a responsible adult available to be with you. You may also experience a dry mouth or nausea for 24 hours. This is a normal side effect and will disappear as the effects of the medication wear off.    REMEMBER   If you experience any difficulty breathing, chest pain, bleeding that you feel is  excessive, persistent nausea or vomiting or for any other concerns:  Call your physician Dr.  Renette Butters, Bruna Potter, MD   at [phone]. You may also ask to have the general doctor on call paged. They are available to you 24 hours a day.      SPECIAL INSTRUCTIONS / COMMENTS   Follow up with Dr. Lorin Picket as needed. The findings of your procedure today were: normal anastamosis and diverticulosis.     FOLLOW-UP APPOINTMENTS   Please call your surgeon's office at the number listed to schedule a date / time of return for follow-up.     Dr Laqueta Due  [phone]

## 2022-07-22 NOTE — Anesthesia Preprocedure Evaluation (Signed)
ANESTHESIA PRE-OP EVALUATION  Planned Procedure: COLONOSCOPY  Review of Systems     anesthesia history negative     patient summary reviewed  nursing notes reviewed        Pulmonary   sleep apnea and CPAP,   Cardiovascular    Hypertension, ECG reviewed and hyperlipidemia , Exercise Tolerance: > or = 4 METS        GI/Hepatic/Renal   negative GI/hepatic/renal ROS,  diverticulitis        Endo/Other    osteoarthritis and drug induced coagulopathy,      Neuro/Psych/MS   negative neuro/psych ROS,      Cancer    negative hematology/oncology ROS,               Physical Assessment      Airway       Mallampati: II    TM distance: <3 FB    Mouth Opening: good.            Dental       Dentition intact             Pulmonary    Breath sounds clear to auscultation       Cardiovascular    Rhythm: regular  Rate: Normal       Other findings          Plan  ASA 3     Planned anesthesia type: general     general intravenous            SLEEP APNEA  Patient is at risk of obstructive sleep apnea and Education provided regarding risk of obstructive sleep apnea        Intravenous induction       Anesthetic plan and risks discussed with patient  signed consent obtained          Patient's NPO status is appropriate for Anesthesia.

## 2022-07-25 ENCOUNTER — Ambulatory Visit (INDEPENDENT_AMBULATORY_CARE_PROVIDER_SITE_OTHER): Payer: Self-pay | Admitting: Ophthalmology

## 2022-07-25 ENCOUNTER — Telehealth (INDEPENDENT_AMBULATORY_CARE_PROVIDER_SITE_OTHER): Payer: Self-pay | Admitting: Surgery

## 2022-07-25 NOTE — Telephone Encounter (Signed)
Spoke to pt. Scheduled pt in open slot for 08/10/2022 at 8:15 AM with Dr. Alfonse Spruce. Garey Ham, RN

## 2022-07-25 NOTE — Telephone Encounter (Signed)
Regarding: Dr. Alfonse Spruce  ----- Message from Myrtha Mantis sent at 07/25/2022 10:46 AM EST -----  Dr, Alfonse Spruce  Dr. Vernona Rieger is referring pt for possible [facility] on R lower lid and is requesting an apt after 3/15 but if we can get him in sooner than 4/30 to please call him at [phone]

## 2022-08-08 ENCOUNTER — Ambulatory Visit (INDEPENDENT_AMBULATORY_CARE_PROVIDER_SITE_OTHER): Payer: Self-pay | Admitting: Ophthalmology

## 2022-08-08 NOTE — Telephone Encounter (Signed)
Regarding: Dr. Alfonse Spruce  ----- Message from Myrtha Mantis sent at 08/08/2022  9:12 AM EDT -----  Dr. Alfonse Spruce  Patient called asking what to expect at his apt on 3/20 for poss [facility] on R lower lid, he is asking if he would need to make arrangements to stay over night.  Please call him at [phone]

## 2022-08-08 NOTE — Telephone Encounter (Signed)
Spoke to pt. Advised that he will not have to stay overnight for his appointment. Garey Ham, RN

## 2022-08-10 ENCOUNTER — Other Ambulatory Visit: Payer: Self-pay

## 2022-08-10 ENCOUNTER — Ambulatory Visit: Payer: Medicare PPO | Attending: Ophthalmology | Admitting: Ophthalmology

## 2022-08-10 ENCOUNTER — Encounter (INDEPENDENT_AMBULATORY_CARE_PROVIDER_SITE_OTHER): Payer: Self-pay | Admitting: Ophthalmology

## 2022-08-10 ENCOUNTER — Encounter (HOSPITAL_BASED_OUTPATIENT_CLINIC_OR_DEPARTMENT_OTHER): Payer: Self-pay | Admitting: MOHS-Micrographic Surgery

## 2022-08-10 ENCOUNTER — Inpatient Hospital Stay (HOSPITAL_COMMUNITY)
Admission: RE | Admit: 2022-08-10 | Discharge: 2022-08-10 | Disposition: A | Discharge status: Home / Self Care | Payer: Medicare PPO | Source: Ambulatory Visit

## 2022-08-10 ENCOUNTER — Ambulatory Visit (HOSPITAL_BASED_OUTPATIENT_CLINIC_OR_DEPARTMENT_OTHER): Payer: Medicare PPO | Admitting: MOHS-Micrographic Surgery

## 2022-08-10 VITALS — BP 150/88 | HR 71 | Temp 97.7°F | Resp 18 | Wt 177.3 lb

## 2022-08-10 DIAGNOSIS — D485 Neoplasm of uncertain behavior of skin: Secondary | ICD-10-CM | POA: Insufficient documentation

## 2022-08-10 DIAGNOSIS — C441122 Basal cell carcinoma of skin of right lower eyelid, including canthus: Secondary | ICD-10-CM

## 2022-08-10 DIAGNOSIS — D489 Neoplasm of uncertain behavior, unspecified: Secondary | ICD-10-CM

## 2022-08-10 DIAGNOSIS — H2513 Age-related nuclear cataract, bilateral: Secondary | ICD-10-CM

## 2022-08-10 DIAGNOSIS — Z85828 Personal history of other malignant neoplasm of skin: Secondary | ICD-10-CM | POA: Insufficient documentation

## 2022-08-10 DIAGNOSIS — Z792 Long term (current) use of antibiotics: Secondary | ICD-10-CM

## 2022-08-10 DIAGNOSIS — C4402 Squamous cell carcinoma of skin of lip: Secondary | ICD-10-CM

## 2022-08-10 MED ORDER — DOXYCYCLINE HYCLATE 100 MG CAPSULE
100.0000 mg | ORAL_CAPSULE | Freq: Two times a day (BID) | ORAL | 0 refills | Status: DC
Start: 2022-08-10 — End: 2022-09-15

## 2022-08-10 MED ORDER — LIDOCAINE 1%-EPI 1:100,000 BUFFERED W/8.4% SOD BICARB(3 ML)INJ SYRINGE
3.0000 mL | INJECTION | Freq: Once | INTRAMUSCULAR | Status: AC
Start: 2022-08-10 — End: 2022-08-10
  Administered 2022-08-10: 7.9 mL via INTRAMUSCULAR

## 2022-08-10 NOTE — Progress Notes (Addendum)
[facility]  [place] [address]  Operated by [facility]         Patient Name: Travis Brooks  MRN#: [id number]  [place]: Jun 25, 1955    Date of Service: 08/10/2022    Chief Complaint    Skin  Lesion         Travis Brooks is a 67 y.o. male who presents today for evaluation/consultation of:  HPI    Pt is here for NPV, poss BCC RLL   Pt states that they have had this spot for over a year now. States that size varies. States that sometimes it will go down to where you need to look for it and then it will then grow again   Pt states that when it first came up it presented with a white head which did cause some mild pain. Notes that now they will have occ flare ups causing irritation   Pt has had previous h/o skin cancer removed. States that they have had various removed from back and chest  Pt denies new FOL, new floaters, double vision, distortion, HA, and pain  Pt using emycin PRN   Last edited by Willette Pa, COA on 08/10/2022  8:03 AM.        ROS    Positive for: Eyes  Negative for: Constitutional, Gastrointestinal, Neurological, Skin, Genitourinary, Musculoskeletal, HENT, Endocrine, Cardiovascular, Respiratory, Psychiatric, Allergic/Imm, Heme/Lymph  Last edited by Willette Pa, [place] on 08/10/2022  8:03 AM.         All other systems Negative    Willette Pa, [place]  08/10/2022, 08:07    Ophthalmologist/Optometrist:     History of Sun Exposure: yes If yes:   Excessive sun burn:   History radiation to head:   History of skin cancer:  hx of skin CA with dermatology and head chest, neck      Past Ocular history:   Past Surgical History:   Procedure Laterality Date    COLON SURGERY      COLONOSCOPY N/A 07/22/2022    Performed by Willow Ora, MD at [facility] OR T&D    HX BACK SURGERY      HX HERNIA REPAIR      HX HIP REPLACEMENT Right     RIGHT TOTAL HIP ARTHROPLASTY USING NONCEMENTED ACCOLADE II IMPLANTS Right 08/13/2021    Performed by Karsten Ro, MD at [facility]  [place], [place] 08/10/2022, 13:56    MD Addition to HPI:    The patient is a 67 y.o. male here with complaint of:    NPV, referred by Dr. Vernona Rieger for possible RLL BCC. Patient states the lesion has eben present for over a year. He complains of occasional irritation. Denies pain.     Assessment     Impression(s):      ICD-10-CM    1. Basal cell carcinoma (BCC) of skin of right lower eyelid including canthus  C44.1122 OPH EXTERNAL PHOTOS     CANCELED: OPH EXTERNAL PHOTOS      2. Nuclear senile cataract of both eyes  H25.13           Plan(s)/Recommendation(s):      3.66mm x 11.27mm RLL Margin ulcerations  - Madarosis R side with growth  - suspected BCC   - disc r/b/a of Mohs' reconstruction - pt understands and consents   The patient was educated on the importance of avoiding excessive  sun exposure and wearing sunscreen daily. Advised patient to re-apply sunscreen every 2-3 hours. Advised the patient to avoid going to the tanning beds. Advised to check periocular skin routinely for any changes, especially any new moles or changes in existing moles.    Dr. Theodis Sato in [facility] -  dermatologist    [facility] OU  Not significant  Continue current Rx    SURGERY    Procedure: R Mohs' Reconstruction combined with Dr. Obie Dredge     Diagnosis:     ICD-10-CM    1. Basal cell carcinoma (BCC) of skin of right lower eyelid including canthus  C44.1122 OPH EXTERNAL PHOTOS     CANCELED: OPH EXTERNAL PHOTOS      2. Nuclear senile cataract of both eyes  H25.13           Anesthesia: MAC    Length of time: 60 minutes    Special Needs:    Risks, benefits, and alternatives explained.    Pt understands and elects to proceed with surgery/desires for surgery  Imaging (CT/MRI) Reviewed:  I have seen and reviewed Radiology Images   N/A    Results Reviewed:         I saw and examined the patient.  I reviewed the technician/resident's note.  I agree with the findings and plan of care as documented in the resident's note.  Any  exceptions/additions are edited/noted.  Veto Kemps, MD 08/10/2022  13:56    I am scribing for, and in the presence of, Dr. Alfonse Spruce for services provided on 08/10/2022.  Hyman Bower, SCRIBE   Hyman Bower, SCRIBE  08/10/2022, 08:31    I personally performed the services described in this documentation, as scribed  in my presence, and it is both accurate  and complete.  Veto Kemps, MD 08/10/2022  13:56      Base Eye Exam       Visual Acuity (Snellen - Linear)         Right Left    Dist cc 20/20 20/25 +2      Correction: Glasses              Tonometry (Tonopen, 8:15 AM)         Right Left    Pressure 17 18              Pupils         Pupils Shape React APD    Right PERRL Round Slow None    Left PERRL Round Sluggish None              Visual Fields (Counting fingers)         Right Left     Full Full              Extraocular Movement         Right Left     Full Full              Neuro/Psych       Oriented x3: Yes    Mood/Affect: Normal                  Slit Lamp and Fundus Exam       External Exam         Right Left    External had hx of skin CA with dermatology and head chest, neck Normal              Slit Lamp Exam  Right Left    Lids/Lashes patches of RLL margin madarosis with ulceration, Dermatochalasis - upper lid Dermatochalasis - upper lid    Conjunctiva/Sclera White and quiet White and quiet    Cornea Clear Clear    Anterior Chamber Deep and quiet Deep and quiet    Iris Round and reactive Round and reactive    Lens Trace Nuclear sclerosis Trace Nuclear sclerosis              Fundus Exam         Right Left    Disc Normal Normal    Macula Normal Normal    Vessels Normal Normal                  Refraction       Wearing Rx         Sphere Cylinder Axis Add    Right -1.00 +1.00 014 +2.50    Left -0.25 +0.75 177 +2.50      Age: 63yr    Type: PAL                        I have seen and examined the above patient. I discussed the above diagnoses listed in the assessment and the above ophthalmic plan of care with the  patient and patient's family. All questions were answered. I reviewed and, when necessary, made changes to the technician/resident note, documented ophthalmology exam, chief complaint, history of present illness, allergies, review of systems, past medical, past surgical, family and social history.    Veto Kemps, MD 08/10/2022  13:56

## 2022-08-11 ENCOUNTER — Encounter (INDEPENDENT_AMBULATORY_CARE_PROVIDER_SITE_OTHER): Payer: Self-pay | Admitting: Ophthalmology

## 2022-08-11 NOTE — Progress Notes (Signed)
Lvm for pt to see if they wanted to set up surgery with dr.nguyen on 08/11/22 at 11:30am [place]

## 2022-08-19 ENCOUNTER — Encounter (HOSPITAL_BASED_OUTPATIENT_CLINIC_OR_DEPARTMENT_OTHER): Payer: Self-pay | Admitting: MOHS-Micrographic Surgery

## 2022-08-19 LAB — SURGICAL PATHOLOGY SPECIMEN

## 2022-08-22 ENCOUNTER — Other Ambulatory Visit (HOSPITAL_BASED_OUTPATIENT_CLINIC_OR_DEPARTMENT_OTHER): Payer: Self-pay | Admitting: MOHS-Micrographic Surgery

## 2022-08-22 DIAGNOSIS — C4402 Squamous cell carcinoma of skin of lip: Secondary | ICD-10-CM

## 2022-08-22 NOTE — Nursing Note (Signed)
Called the pt to discuss the biopsy results and options. Pt opted to keep appt on 4/11 to remove BCC on the right lower eyelid with repair by Dr. Alfonse Spruce on the same day. Will coordinate Mohs on the mid lower lip with repair by Dr. Jerene Canny (referral placed).    Timmothy Sours MD, PhD  Mohs Micrographic Surgery  Department of Dermatology

## 2022-08-22 NOTE — Telephone Encounter (Signed)
Gerald Stabs, RN 08/19/2022 12:17 PM EDT      ----- Message -----  From: Madlyn Frankel  Sent: 08/19/2022 12:15 PM EDT  To: Payton Mccall[facility]  Subject: Biopsy results     What are the test results from my biopsies from last week.

## 2022-08-22 NOTE — Telephone Encounter (Signed)
I called the pt to discuss results and referral to Facial Plastic Surgery for repair of lower lip. Pt already scheduled for Mohs and Oculoplastics repair for right lower eyelid.    Travis Sours MD, PhD  Mohs Micrographic Surgery  Department of Dermatology

## 2022-08-25 ENCOUNTER — Inpatient Hospital Stay (HOSPITAL_COMMUNITY)
Admission: RE | Admit: 2022-08-25 | Discharge: 2022-08-25 | Disposition: A | Discharge status: Home / Self Care | Payer: Medicare PPO | Source: Ambulatory Visit

## 2022-08-25 ENCOUNTER — Encounter (HOSPITAL_BASED_OUTPATIENT_CLINIC_OR_DEPARTMENT_OTHER): Payer: Self-pay | Admitting: MOHS-Micrographic Surgery

## 2022-08-25 ENCOUNTER — Encounter (HOSPITAL_COMMUNITY): Payer: Self-pay | Admitting: Ophthalmology

## 2022-08-25 NOTE — Progress Notes (Signed)
Lynwood DawleyDERMATOLOGY, MARY BABB [facility]  [address]  [place] [id number]  [facility] by [facility]     Name: Travis Brooks MRN:  [id number]   Date: 08/10/2022 Age: 67 y.o.       MOHS MICROGRAPHIC SURGERY/HISTORY AND PHYSICAL     Diagnosis and Location:     A.  SHAVE BIOPSY, RIGHT LOWER EYELID:            neoplasm of uncertain behavior      B.  SHAVE BIOPSY, MID LOWER LIP:            neoplasm of uncertain behavior    Pathology Report # [id number]   Underlying nerves or other vital structures intact: yes  Previous treatment(s) and date(s): no     Medical History  Smoking or chewing tobacco: no  Alcohol Use: no  Drug addiction:no  Bleeding tendency: yes, on ASA  Bleeding disorders or low platelets: no  Blood thinners: yes, on ASA    Cardiac and Circulatory History  Angina: no  CHF: no  Stents: no  Heart bypass: no  MI: no  Pacemaker: no  Implanted Defibrillator: no  Neurostimulator: no  Other Electronic Implants: no  Valvular Heart Disease: no  Prosthetic Heart Device: no  Arrhythmia: no  Endocarditis/Pericarditis: no  Peripheral Vascular Disease: no    Pulmonary Disease  Pulmonary Embolus, blood clots: no  Asthma: no  COPD: no  Any problems lying flat? no  DOE (If yes characterized by severity, type of activity): no    Other  Convulsions: no  Stroke: no  "Passing out" or dizziness with vaccination, venipuncture, blood, pain, other: no  Hepatitis/Cirrhosis: no  Kidney disease: no  HIV: no  Herpes or cold sore: no  Immunosuppression: no  History of wound infections: no  Prosthetic hips, knees, shoulders, implants: right hip replacement  Previous radiation treatment: no  Glaucoma: no  Hyperthyroidism: no  Diabetes: no  HTN: yes, well controlled on medications  Other medical conditions: no    COVID-19 History  Nursing Home with Known Outbreak: no  Prison: no  Household contact to a COVID-positive person: no  Patient with symptoms of COVID-19: no    Patient is not a male of childbearing  age.    Medications:  Current Outpatient Medications   Medication Sig    amLODIPine (NORVASC) 5 mg Oral Tablet Take 1 Tablet (5 mg total) by mouth Once a day    aspirin (ECOTRIN) 81 mg Oral Tablet, Delayed Release (E.C.) Take 1 Tablet (81 mg total) by mouth Once a day    doxycycline hyclate (VIBRAMYCIN) 100 mg Oral Capsule Take 1 Capsule (100 mg total) by mouth Twice daily Start taking 2 days before surgery on a full stomach with full glass of water.    erythromycin (ROMYCIN) 5 mg/gram (0.5 %) Ophthalmic Ointment Four times a day    ketoconazole (NIZORAL) 2 % Shampoo Apply 1 Application topically Once per day as needed Apply to face once daily    losartan (COZAAR) 50 mg Oral Tablet Take 1 Tablet (50 mg total) by mouth Once a day    magnesium oxide (MAG-OX) 400 mg Oral Tablet Take 1 Tablet (400 mg total) by mouth Every evening    metoprolol succinate (TOPROL-XL) 50 mg Oral Tablet Sustained Release 24 hr Take 1 Tablet (50 mg total) by mouth Once a day    MV with Min-Lycopene-Lutein (CENTRUM SILVER) 0.4 mg-300 mcg- 250 mcg Oral Tablet Take 1 Tablet by mouth  Once a day    polyvinyl alcohol-povidone (REFRESH) 1.4-0.6 % Ophthalmic Dropperette Instill 1 Drop into both eyes Twice daily    potassium chloride (K-DUR) 20 mEq Oral Tab Sust.Rel. Particle/Crystal Take 1 Tablet (20 mEq total) by mouth Once a day    rosuvastatin (CRESTOR) 10 mg Oral Tablet Take 1 Tablet (10 mg total) by mouth Once a day      Physical Exam  Vital Signs: BP (!) 150/88 (Patient Position: Sitting)   Pulse 71   Temp 36.5 C (97.7 F) (Oral)   Resp 18   Wt 80.4 kg (177 lb 4.8 oz)   SpO2 98%   BMI 26.96 kg/m      Appearance: well-groomed: yes, oriented to person, place and time: yes, patient cooperative: yes.  HEENT: no obvious deformities, masses, asymmetry  Neck: no obvious deformities, masses, asymmetry, tracheal deviation  Heart: Regular rate & rhythm without murmurs  Pulmonary: Clear sounds bilaterally without rales, rhonchi or wheezes  Lymph  Nodes: no palpable lymph nodes in the submandibular, cervical, clavicular, axillary areas  Neurology:      Motor function in area of surgery: normal.       Sensory function in area of surgery: normal.    Allergies:  No Known Allergies     Assessment/Plan    Mohs Pre-Op Progress Note  Travis Brooks is a 67 y.o. male  here for evaluation of:    A.  SHAVE BIOPSY, RIGHT LOWER EYELID:            neoplasm of uncertain behavior      B.  SHAVE BIOPSY, MID LOWER LIP:            neoplasm of uncertain behavior    Discussed Mohs Micrographic Surgery in detail, including possible complications and alternative treatment options. Patient and physicians agree that Mohs Micrographic Surgery is the best therapeutic plan.    - Informed consent and photo consent obtained.   - Prescription(s) given for doxycycline 100mg  PO BID for 14 days   - Advised pt to eat a hearty meal before the surgery.  - Advised pt to bring snacks and something to occupy their mind such as book, tablet during the waiting periods.  - Advised pt that they can bring one person to accompany them, but that this person would have to wait in the waiting room during the procedures.    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  Pt warned about the risk of bleeding, infection, scarring and recurrence. Biopsy performed on the eyelid margin.  - Site anesthetized with Lidocaine, 0.3 cm tangential biopsy performed - sent to pathology  - Discussed that scarring, infection, recurrence, pain, bleeding - are all possible side effects of the biopsy  - Wound care provided and discussed    Neoplasm of undetermined behavior of the skin, mid lower lip: AK vs SCC vs cold sore  - Possible etiologies discussed  - Photo uploaded to EMR  - Verbal consent obtained, time out performed.  Pt warned about the risk of  bleeding, infection, scarring and recurrence.  - Site anesthetized with Lidocaine, 0.4 cm tangential biopsy performed - sent to pathology  - Discussed that scarring, infection, recurrence, pain, bleeding - are all possible side effects of the biopsy  - Wound care provided and discussed  Please schedule patient for Mohs Surgery.    Will coordinate repair with Facial Plastic Surgery for mid lower lip repair and with Oculoplastic Surgery for right lower eyelid repair.    Cecilio AsperVlad Halayna Blane, MD

## 2022-08-29 ENCOUNTER — Ambulatory Visit (INDEPENDENT_AMBULATORY_CARE_PROVIDER_SITE_OTHER): Payer: Self-pay | Admitting: Ophthalmology

## 2022-08-29 NOTE — Telephone Encounter (Signed)
Regarding: Travis Brooks  ----- Message from Silver Huguenin sent at 08/29/2022  9:14 AM EDT -----  Pt called to make sure we got his records from his PCP

## 2022-08-29 NOTE — Telephone Encounter (Signed)
Message sent to Ashley N. Mayre Bury L Slater Mcmanaman, RN

## 2022-08-29 NOTE — H&P (Unsigned)
Addendum:  none      Perioperative Evaluation Center  Department of Anesthesiology  Optimization Visit  History and Physical     Travis Brooks, Travis Brooks 67 y.o. male Medical Record Number: [id number]   Date of Birth:  02-04-1956 Date of Service:  08/31/22    Information Obtained from: {HISTORY PROVIDED BY:19225::"patient"} Surgeon:  Surgeon(s):  Stark Jock, MD    Date of Surgery: 09/01/2022  Planned anesthesia: Monitor Anesthesia Care   Estimated Case Length: 90 Min     CHIEF COMPLAINT  Pre-Operative Optimization Visit for RECONSTRUCTION EYELID:  due to [facility].        RECOMMENDATION  SUMMARY: The surgery is considered {Surgery urgency:44989}.  The surgery is considered {Surgery Intensity:44990}.  Recommend  {Surgery recommendation:44991}.    Patient has the following outstanding needs for optimization:   {RiskStratificationNeeds:47498}  Patient would benefit from active incentive spirometer, early ambulation following surgery and multi-modal pain management approach as appropriate.  DVT prophylaxis as appropriate.  Please see addendum for any updates after 08/31/22      ASSESSMENT & PLAN  Preoperative Examination  BCC  HTN  HLD      MEDICATION INSTRUCTIONS    Take the following medications the morning of surgery: amlodipine, doxycycline, metoprolol, rosuvastatin.  Hold losartan the night prior to surgery and the day of surgery.     Aspirin"     Hold the following medications the day of surgery: magnesium, potassium    Hold NSAIDs (Motrin, Advil, Aleve, Ibuprofen, Naprosyn), vitamins, fish oil, and herbal supplements 7 days prior to the procedure.    Per Dr. Cyndie Chime instructions, "Aspirin, Coumadin is best stopped at least 7 days before surgery. A message can be sent to Dr. Cyndie Chime if the patient has not stopped the medicines, and he will determine case, by case, if the surgery can still be done for small procedures only. For Plavix, Effient, Eliquis, please stop at least 3 days before surgery."      DIET INSTRUCTIONS    {DIET:45017}      HPI  Travis Brooks is a 67 y.o. male that presents with need for pre-operative optimization.  ***      Operative Reference Anesthesia  ASA  {ASA Class:44993}    METS  {METS:44994}    History of difficult intubation {Yes/No:21034203}    Personal or family history of anesthesia problems {Yes/No:21034203}    OSA  {Sleep Apnea Compliance:21040503}      Operative Reference Surgery  Allergy to the following: latex, metal/nickel, iodine/shellfish, acrylic, egg/propofol, all opioids {Yes/No:21034203}    Recent Hospitalization (within the last 3 months) {Yes/No:21034203}    Current Tobacco Use  {Yes/No-Amount:21040504}    Current Alcohol Use {Yes/No-Amount:21040504}    Current Drug Use  {Yes/No-Amount:21040504}      Assessment Tools   Hx of Lung Disease   {Yes/NoAriscat:47844}   Concern for Cardiac Ischemia {Yes/No2:47846}   History of PE/DVT and/or Clotting Disorder {yes/nocaprini:47845}   History of Liver Disease {yes/novocal:47847}   Frailty Score {yes/nofrail:47848}    Reference:     []  F= Fatigue in the past month? (More unusual fatigue than normal or inability to complete ADLs)  []  R= Resistance- difficulty climbing a flight of stairs?   []  A= Ambulation- difficulty walking one block on level land?  []   I= Illness (HTN, DM, Cancer (other than minor skin CA), Chronic Lung Disease, Heart Attack, CHF, Angina, Asthma, Arthritis, Stroke, CKD); 5+=1 point; <5=0  []   L= Loss of weight- have you lost >5% of your  body weight in the past year?   AD8 {MW1:02725}     STOP-BANG:        No data to display                     {Please consider pain team referral for any patient methadone or Suboxone or MME of > 60/day.:123}    {Please consider reviewing these pertinent diagnoses during PEC appt  Pulm: Recent URI, Asthma, COPD, OSA  Cardiac: CAD, MI, CHF, HTN, HLD, Valvular Disorders, Dysrhythmia, Implantable Device  Neuro: Seizure, CVA, TIA  Vascular: Carotid Artery Disease, Aneurysm  GI: Hepatitis, Cirrhosis,  GERD  GU: Kidney disease  Endocrine: Diabetes, Thyroid disorder  Heme/Immune: Anemia, Bleeding or Clotting disorder, Cancer, History of VTE:123}    Past Medical History:  Past Medical History:   Diagnosis Date    Diverticulitis     Hyperlipidemia     Hypertension     Osteoarthritis     Sleep apnea      Past Surgical History:   Procedure Laterality Date    COLON SURGERY      HX BACK SURGERY      HX HERNIA REPAIR      HX HIP REPLACEMENT Right      Family Medical History:    None             Review of Systems:  <ROSPATIENTANESTHESIA>    Exam:  There were no vitals taken for this visit.      There is no height or weight on file to calculate BMI.    Physical Exam    Diagnostics:     EKG: 08/31/22    Chest XRAY: ***    Labs:  08/31/22    Other Relevant Diagnostics: ***      Diagnostics included will be reviewed  and followed up as determined necessary prior to procedure         Thyra Breed, FNP-C  08/29/2022, 21:49    Perioperative Evaluation Center  [facility]  [place]    It should be emphasized that the [facility] Provider consultation is to assess the patient's medical conditions and provide recommendations to optimize the patient to decrease peri-operative complications.  Decision on whether or not to proceed to surgery is made at the care team's discretion.

## 2022-08-31 ENCOUNTER — Other Ambulatory Visit (INDEPENDENT_AMBULATORY_CARE_PROVIDER_SITE_OTHER): Payer: Medicare PPO | Admitting: Rheumatology

## 2022-08-31 ENCOUNTER — Ambulatory Visit: Payer: Medicare PPO

## 2022-08-31 ENCOUNTER — Other Ambulatory Visit: Payer: Self-pay

## 2022-08-31 ENCOUNTER — Inpatient Hospital Stay (HOSPITAL_BASED_OUTPATIENT_CLINIC_OR_DEPARTMENT_OTHER)
Admission: RE | Admit: 2022-08-31 | Discharge: 2022-08-31 | Disposition: A | Discharge status: Home / Self Care | Payer: Medicare PPO | Source: Ambulatory Visit

## 2022-08-31 VITALS — BP 155/89 | HR 61 | Temp 97.6°F | Resp 16 | Ht 68.11 in | Wt 178.6 lb

## 2022-08-31 DIAGNOSIS — I10 Essential (primary) hypertension: Secondary | ICD-10-CM

## 2022-08-31 DIAGNOSIS — Z9989 Dependence on other enabling machines and devices: Secondary | ICD-10-CM

## 2022-08-31 DIAGNOSIS — Z01818 Encounter for other preprocedural examination: Secondary | ICD-10-CM

## 2022-08-31 DIAGNOSIS — G4733 Obstructive sleep apnea (adult) (pediatric): Secondary | ICD-10-CM

## 2022-08-31 DIAGNOSIS — C4491 Basal cell carcinoma of skin, unspecified: Secondary | ICD-10-CM | POA: Insufficient documentation

## 2022-08-31 DIAGNOSIS — E7800 Pure hypercholesterolemia, unspecified: Secondary | ICD-10-CM | POA: Insufficient documentation

## 2022-08-31 LAB — BASIC METABOLIC PANEL
ANION GAP: 7 mmol/L (ref 4–13)
BUN/CREA RATIO: 15 (ref 6–22)
BUN: 18 mg/dL (ref 8–25)
CALCIUM: 10.2 mg/dL (ref 8.6–10.3)
CHLORIDE: 106 mmol/L (ref 96–111)
CO2 TOTAL: 27 mmol/L (ref 23–31)
CREATININE: 1.23 mg/dL (ref 0.75–1.35)
ESTIMATED GFR - MALE: 64 mL/min/BSA (ref >=60)
GLUCOSE: 97 mg/dL (ref 65–125)
POTASSIUM: 4.1 mmol/L (ref 3.5–5.1)
SODIUM: 140 mmol/L (ref 136–145)

## 2022-08-31 LAB — POC BLOOD GLUCOSE (RESULTS): GLUCOSE, POC: 95 mg/dL (ref 70–105)

## 2022-08-31 NOTE — Patient Instructions (Signed)
MEDICATION INSTRUCTIONS    Take the following medications the morning of surgery: amlodipine, metoprolol, rosuvastatin.  Hold losartan the night prior to surgery and the day of surgery.   Aspirin: last dose 1 week ago  Hold NSAIDs (Motrin, Advil, Aleve, Ibuprofen, Naprosyn), vitamins, fish oil, and herbal supplements 7 days prior to the procedure.      DIET INSTRUCTIONS   Regular diet until 8 hours prior to arrival. Clear liquids until 2 hours prior to arrival. No tobacco/nicotine or mints for 8 hours prior to arrival.

## 2022-09-01 ENCOUNTER — Ambulatory Visit (HOSPITAL_BASED_OUTPATIENT_CLINIC_OR_DEPARTMENT_OTHER): Payer: Medicare PPO

## 2022-09-01 ENCOUNTER — Inpatient Hospital Stay
Admission: RE | Admit: 2022-09-01 | Discharge: 2022-09-01 | Disposition: A | Discharge status: Home / Self Care | Payer: Medicare PPO | Source: Ambulatory Visit | Attending: Ophthalmology | Admitting: Ophthalmology

## 2022-09-01 ENCOUNTER — Encounter (HOSPITAL_COMMUNITY): Payer: Medicare PPO | Admitting: Ophthalmology

## 2022-09-01 ENCOUNTER — Other Ambulatory Visit (INDEPENDENT_AMBULATORY_CARE_PROVIDER_SITE_OTHER): Payer: Self-pay

## 2022-09-01 ENCOUNTER — Ambulatory Visit (HOSPITAL_BASED_OUTPATIENT_CLINIC_OR_DEPARTMENT_OTHER): Payer: Medicare PPO | Admitting: MOHS-Micrographic Surgery

## 2022-09-01 ENCOUNTER — Encounter (HOSPITAL_COMMUNITY)
Admission: RE | Disposition: A | Discharge status: Home / Self Care | Payer: Self-pay | Source: Ambulatory Visit | Attending: Ophthalmology

## 2022-09-01 ENCOUNTER — Ambulatory Visit (HOSPITAL_COMMUNITY): Payer: Medicare PPO

## 2022-09-01 ENCOUNTER — Encounter (HOSPITAL_BASED_OUTPATIENT_CLINIC_OR_DEPARTMENT_OTHER): Payer: Self-pay | Admitting: MOHS-Micrographic Surgery

## 2022-09-01 ENCOUNTER — Other Ambulatory Visit: Payer: Self-pay

## 2022-09-01 ENCOUNTER — Encounter (HOSPITAL_COMMUNITY): Payer: Self-pay | Admitting: Ophthalmology

## 2022-09-01 VITALS — BP 175/92 | HR 76 | Temp 98.2°F | Resp 18 | Wt 180.2 lb

## 2022-09-01 DIAGNOSIS — Z85828 Personal history of other malignant neoplasm of skin: Secondary | ICD-10-CM

## 2022-09-01 DIAGNOSIS — G4730 Sleep apnea, unspecified: Secondary | ICD-10-CM | POA: Insufficient documentation

## 2022-09-01 DIAGNOSIS — K5792 Diverticulitis of intestine, part unspecified, without perforation or abscess without bleeding: Secondary | ICD-10-CM | POA: Insufficient documentation

## 2022-09-01 DIAGNOSIS — Z79899 Other long term (current) drug therapy: Secondary | ICD-10-CM | POA: Insufficient documentation

## 2022-09-01 DIAGNOSIS — C441222 Squamous cell carcinoma of skin of right lower eyelid, including canthus: Secondary | ICD-10-CM

## 2022-09-01 DIAGNOSIS — Z481 Encounter for planned postprocedural wound closure: Secondary | ICD-10-CM | POA: Insufficient documentation

## 2022-09-01 DIAGNOSIS — I10 Essential (primary) hypertension: Secondary | ICD-10-CM | POA: Insufficient documentation

## 2022-09-01 DIAGNOSIS — E785 Hyperlipidemia, unspecified: Secondary | ICD-10-CM | POA: Insufficient documentation

## 2022-09-01 DIAGNOSIS — C441122 Basal cell carcinoma of skin of right lower eyelid, including canthus: Secondary | ICD-10-CM

## 2022-09-01 DIAGNOSIS — M1990 Unspecified osteoarthritis, unspecified site: Secondary | ICD-10-CM | POA: Insufficient documentation

## 2022-09-01 HISTORY — PX: LATERAL CANTHOPLASTY: SHX1955

## 2022-09-01 HISTORY — DX: Hyperlipidemia, unspecified: E78.5

## 2022-09-01 HISTORY — DX: Sleep apnea, unspecified: G47.30

## 2022-09-01 SURGERY — RECONSTRUCTION EYELID
Anesthesia: General | Site: Eye | Laterality: Right | Wound class: Clean Wound: Uninfected operative wounds in which no inflammation occurred

## 2022-09-01 MED ORDER — ERYTHROMYCIN 5 MG/GRAM (0.5 %) EYE OINTMENT
TOPICAL_OINTMENT | Freq: Once | OPHTHALMIC | Status: DC | PRN
Start: 2022-09-01 — End: 2022-09-01

## 2022-09-01 MED ORDER — EPHEDRINE SULFATE 5 MG/ML INTRAVENOUS SOLUTION
Freq: Once | INTRAVENOUS | Status: DC | PRN
Start: 2022-09-01 — End: 2022-09-01
  Administered 2022-09-01 (×2): 5 mg via INTRAVENOUS
  Administered 2022-09-01: 10 mg via INTRAVENOUS

## 2022-09-01 MED ORDER — LIDOCAINE (PF) 100 MG/5 ML (2 %) INTRAVENOUS SYRINGE
INJECTION | Freq: Once | INTRAVENOUS | Status: DC | PRN
Start: 2022-09-01 — End: 2022-09-01
  Administered 2022-09-01: 100 mg via INTRAVENOUS

## 2022-09-01 MED ORDER — DEXAMETHASONE SODIUM PHOSPHATE 4 MG/ML INJECTION SOLUTION
Freq: Once | INTRAMUSCULAR | Status: DC | PRN
Start: 2022-09-01 — End: 2022-09-01
  Administered 2022-09-01: 4 mg via INTRAVENOUS

## 2022-09-01 MED ORDER — ONDANSETRON HCL (PF) 4 MG/2 ML INJECTION SOLUTION
INTRAMUSCULAR | Status: AC
Start: 2022-09-01 — End: 2022-09-01
  Filled 2022-09-01: qty 2

## 2022-09-01 MED ORDER — SUGAMMADEX 100 MG/ML INTRAVENOUS SOLUTION
INTRAVENOUS | Status: AC
Start: 2022-09-01 — End: 2022-09-01
  Filled 2022-09-01: qty 2

## 2022-09-01 MED ORDER — SODIUM CHLORIDE 0.9% FLUSH BAG - 250 ML
INTRAVENOUS | Status: DC | PRN
Start: 2022-09-01 — End: 2022-09-01

## 2022-09-01 MED ORDER — PHENYLEPHRINE 1 MG/10 ML (100 MCG/ML) IN 0.9 % SOD.CHLORIDE IV SYRINGE
INJECTION | Freq: Once | INTRAVENOUS | Status: DC | PRN
Start: 2022-09-01 — End: 2022-09-01
  Administered 2022-09-01 (×4): 100 ug via INTRAVENOUS

## 2022-09-01 MED ORDER — ONDANSETRON HCL (PF) 4 MG/2 ML INJECTION SOLUTION
Freq: Once | INTRAMUSCULAR | Status: DC | PRN
Start: 2022-09-01 — End: 2022-09-01
  Administered 2022-09-01: 4 mg via INTRAVENOUS

## 2022-09-01 MED ORDER — SODIUM CHLORIDE 0.9 % (FLUSH) INJECTION SYRINGE
2.0000 mL | INJECTION | INTRAMUSCULAR | Status: DC | PRN
Start: 2022-09-01 — End: 2022-09-01

## 2022-09-01 MED ORDER — ERYTHROMYCIN 5 MG/GRAM (0.5 %) EYE OINTMENT
TOPICAL_OINTMENT | Freq: Four times a day (QID) | OPHTHALMIC | 2 refills | Status: DC
Start: 2022-09-01 — End: 2023-12-19

## 2022-09-01 MED ORDER — FENTANYL (PF) 50 MCG/ML INJECTION SOLUTION
Freq: Once | INTRAMUSCULAR | Status: DC | PRN
Start: 2022-09-01 — End: 2022-09-01
  Administered 2022-09-01: 100 ug via INTRAVENOUS

## 2022-09-01 MED ORDER — SODIUM CHLORIDE 0.9 % (FLUSH) INJECTION SYRINGE
2.0000 mL | INJECTION | Freq: Three times a day (TID) | INTRAMUSCULAR | Status: DC
Start: 2022-09-01 — End: 2022-09-01

## 2022-09-01 MED ORDER — LIDOCAINE (PF) 3.5 % EYE GEL
OPHTHALMIC | Status: AC
Start: 2022-09-01 — End: 2022-09-01
  Filled 2022-09-01: qty 1

## 2022-09-01 MED ORDER — ROCURONIUM 10 MG/ML INTRAVENOUS SYRINGE WRAPPER
INJECTION | INTRAVENOUS | Status: AC
Start: 2022-09-01 — End: 2022-09-01
  Filled 2022-09-01: qty 5

## 2022-09-01 MED ORDER — LACTATED RINGERS INTRAVENOUS SOLUTION
INTRAVENOUS | Status: DC
Start: 2022-09-01 — End: 2022-09-01
  Administered 2022-09-01 (×2): 0 via INTRAVENOUS
  Administered 2022-09-01: 0 mL via INTRAVENOUS

## 2022-09-01 MED ORDER — PROPOFOL 10 MG/ML INTRAVENOUS EMULSION
INTRAVENOUS | Status: AC
Start: 2022-09-01 — End: 2022-09-01
  Filled 2022-09-01: qty 20

## 2022-09-01 MED ORDER — PROPARACAINE 0.5 % EYE DROPS
OPHTHALMIC | Status: AC
Start: 2022-09-01 — End: 2022-09-01
  Filled 2022-09-01: qty 15

## 2022-09-01 MED ORDER — LIDOCAINE (PF) 20 MG/ML (2 %) INJECTION SOLUTION
INTRAMUSCULAR | Status: AC
Start: 2022-09-01 — End: 2022-09-01
  Filled 2022-09-01: qty 5

## 2022-09-01 MED ORDER — ACETAMINOPHEN 1,000 MG/100 ML (10 MG/ML) INTRAVENOUS SOLUTION
Freq: Once | INTRAVENOUS | Status: DC | PRN
Start: 2022-09-01 — End: 2022-09-01
  Administered 2022-09-01: 1000 mg via INTRAVENOUS

## 2022-09-01 MED ORDER — LIDOCAINE 1%-EPI 1:100,000 BUFFERED W/8.4% SOD BICARB(3 ML)INJ SYRINGE
INJECTION | INTRAMUSCULAR | Status: AC
Start: 2022-09-01 — End: 2022-09-01
  Filled 2022-09-01: qty 15

## 2022-09-01 MED ORDER — FENTANYL (PF) 50 MCG/ML INJECTION SOLUTION
12.5000 ug | INTRAMUSCULAR | Status: DC | PRN
Start: 2022-09-01 — End: 2022-09-01

## 2022-09-01 MED ORDER — ROCURONIUM 10 MG/ML INTRAVENOUS SYRINGE WRAPPER
INJECTION | Freq: Once | INTRAVENOUS | Status: DC | PRN
Start: 2022-09-01 — End: 2022-09-01
  Administered 2022-09-01: 50 mg via INTRAVENOUS
  Administered 2022-09-01: 10 mg via INTRAVENOUS

## 2022-09-01 MED ORDER — LACTATED RINGERS INTRAVENOUS SOLUTION
INTRAVENOUS | Status: DC
Start: 2022-09-01 — End: 2022-09-01

## 2022-09-01 MED ORDER — PROPOFOL 10 MG/ML IV BOLUS
INJECTION | Freq: Once | INTRAVENOUS | Status: DC | PRN
Start: 2022-09-01 — End: 2022-09-01
  Administered 2022-09-01: 200 mg via INTRAVENOUS

## 2022-09-01 MED ORDER — MIDAZOLAM 1 MG/ML INJECTION SOLUTION
INTRAMUSCULAR | Status: AC
Start: 2022-09-01 — End: 2022-09-01
  Filled 2022-09-01: qty 2

## 2022-09-01 MED ORDER — DEXTROSE 5% IN WATER (D5W) FLUSH BAG - 250 ML
INTRAVENOUS | Status: DC | PRN
Start: 2022-09-01 — End: 2022-09-01

## 2022-09-01 MED ORDER — LIDOCAINE 20 MG/ML (2 %)-EPINEPHRINE 1:100,000 INJECTION SOLUTION
15.0000 mL | Freq: Once | INTRAMUSCULAR | Status: DC | PRN
Start: 2022-09-01 — End: 2022-09-01
  Administered 2022-09-01: 3 mL via INTRADERMAL

## 2022-09-01 MED ORDER — PHENYLEPHRINE 50 MG/250 ML (200 MCG/ML) IN 0.9 % SODIUM CHLORIDE IV
INTRAVENOUS | Status: DC | PRN
Start: 2022-09-01 — End: 2022-09-01
  Administered 2022-09-01: 0 ug/kg/min via INTRAVENOUS
  Administered 2022-09-01: .5 ug/kg/min via INTRAVENOUS

## 2022-09-01 MED ORDER — FENTANYL (PF) 50 MCG/ML INJECTION SOLUTION
25.0000 ug | INTRAMUSCULAR | Status: DC | PRN
Start: 2022-09-01 — End: 2022-09-01

## 2022-09-01 MED ORDER — SUGAMMADEX 100 MG/ML INTRAVENOUS SOLUTION
Freq: Once | INTRAVENOUS | Status: DC | PRN
Start: 2022-09-01 — End: 2022-09-01
  Administered 2022-09-01: 330 mg via INTRAVENOUS

## 2022-09-01 MED ORDER — PROPARACAINE 0.5 % EYE DROPS
1.0000 [drp] | Freq: Once | OPHTHALMIC | Status: DC | PRN
Start: 2022-09-01 — End: 2022-09-01

## 2022-09-01 MED ORDER — FENTANYL (PF) 50 MCG/ML INJECTION SOLUTION
INTRAMUSCULAR | Status: AC
Start: 2022-09-01 — End: 2022-09-01
  Filled 2022-09-01: qty 2

## 2022-09-01 MED ORDER — OXYCODONE 5 MG TABLET
5.0000 mg | ORAL_TABLET | Freq: Once | ORAL | Status: DC | PRN
Start: 2022-09-01 — End: 2022-09-01
  Administered 2022-09-01: 5 mg via ORAL
  Filled 2022-09-01: qty 1

## 2022-09-01 MED ORDER — MIDAZOLAM (PF) 1 MG/ML INJECTION SOLUTION
Freq: Once | INTRAMUSCULAR | Status: DC | PRN
Start: 2022-09-01 — End: 2022-09-01
  Administered 2022-09-01: 2 mg via INTRAVENOUS

## 2022-09-01 MED ORDER — LIDOCAINE 1%-EPI 1:100,000 BUFFERED W/8.4% SOD BICARB(3 ML)INJ SYRINGE
3.0000 mL | INJECTION | Freq: Once | INTRAMUSCULAR | Status: AC
Start: 2022-09-01 — End: 2022-09-01
  Administered 2022-09-01: 8.2 mL via INTRAMUSCULAR

## 2022-09-01 MED ORDER — CEPHALEXIN 500 MG CAPSULE
500.0000 mg | ORAL_CAPSULE | Freq: Four times a day (QID) | ORAL | 0 refills | Status: DC
Start: 2022-09-01 — End: 2022-09-01

## 2022-09-01 MED ORDER — ACETAMINOPHEN 1,000 MG/100 ML (10 MG/ML) INTRAVENOUS SOLUTION
INTRAVENOUS | Status: AC
Start: 2022-09-01 — End: 2022-09-01
  Filled 2022-09-01: qty 100

## 2022-09-01 MED ORDER — CEFAZOLIN 10 GRAM SOLUTION FOR INJECTION
2.0000 g | Freq: Once | INTRAMUSCULAR | Status: AC
Start: 2022-09-01 — End: 2022-09-01
  Administered 2022-09-01: 2 g via INTRAVENOUS
  Filled 2022-09-01: qty 10

## 2022-09-01 MED ORDER — DEXAMETHASONE SODIUM PHOSPHATE 4 MG/ML INJECTION SOLUTION
INTRAMUSCULAR | Status: AC
Start: 2022-09-01 — End: 2022-09-01
  Filled 2022-09-01: qty 1

## 2022-09-01 SURGICAL SUPPLY — 17 items
APPL FBRTP 3IN STRL LF  COTTON WD REG TIP (MED SURG SUPPLIES) ×6 IMPLANT
ARMBRD POSITION 20X8X2IN DVN FOAM (IV TUBING & ACCESSORIES) ×2 IMPLANT
CONV USE ITEM 32095 - NEEDLE HYPO  30GA .5IN STD REG BVL LF (MED SURG SUPPLIES) ×2 IMPLANT
CONV USE ITEM 337905 - KIT SURG MIN STUP STRL DISP LF (CUSTOM TRAYS & PACK) ×2 IMPLANT
CORD BIPOLAR 12IN SILVERGLIDE STR CABLE STRL LF  DISP (ENDOSCOPIC SUPPLIES) ×2 IMPLANT
COUNTER 40 CNT MAG DVN BOXLOCKS STRL LF  BLK SHARP 1840 PLASTIC FOAM XL DISP (MED SURG SUPPLIES) ×2 IMPLANT
COVER WND RF DETECT STRL CLR EQP (DRAPE/PACKS/SHEETS/OR TOWEL) ×2 IMPLANT
DROPPER MED GLS STR STY PIP RUB BULB STRL DISP 3IN (MEDICATIONS/SOLUTIONS) ×2 IMPLANT
KIT SURG MIN STUP STRL DISP LF (CUSTOM TRAYS & PACK) ×2
LABEL MED EZ PEEL MRKR LF (MED SURG SUPPLIES) ×2 IMPLANT
NEEDLE HYPO  30GA .5IN STD REG BVL LF (MED SURG SUPPLIES) ×2
PACK SURG CSTM EYE PREP STRL DISP LF (CUSTOM TRAYS & PACK) ×2
PACK SURG CUSTOM EYE PREP STRL DISP LF (CUSTOM TRAYS & PACK) ×2 IMPLANT
PACK SURG ECLIPSE EENT II TBL CVR SUT BAG HEAD TRBN DRP 90X50IN 40X27IN LF (CUSTOM TRAYS & PACK) ×2 IMPLANT
SPONGE GAUZE 4X4IN MDCHC COTTON 12 PLY TY 7 LF  STRL DISP (WOUND CARE SUPPLY) ×2 IMPLANT
STRIP 4X.5IN STRSTRP PLSTR REINF SKNCLS WHT STRL LF (WOUND CARE SUPPLY) ×2 IMPLANT
SYRINGE LL 3ML LF  STRL GRAD N-PYRG DEHP-FR PVC FREE MED DISP CLR (MED SURG SUPPLIES) ×2 IMPLANT

## 2022-09-01 NOTE — Anesthesia Transfer of Care (Signed)
ANESTHESIA TRANSFER OF CARE   Travis Brooks is a 67 y.o. ,male, Weight: 81.7 kg (180 lb 1.9 oz) (stated from early apt)   had Procedure(s):  RECONSTRUCTION EYELID  TARRSORRAPHY  performed  09/01/22   Primary Service: Stark Jock, MD    Past Medical History:   Diagnosis Date    Diverticulitis     Hyperlipidemia     Hypertension     Osteoarthritis     Sleep apnea       Allergy History as of 09/01/22        No Known Allergies                  I completed my transfer of care / handoff to the receiving personnel during which we discussed:  Access, All key/critical aspects of case discussed, Antibiotics, Fluids/Product, Labs, Airway, Analgesia, Expectation of post procedure, Gave opportunity for questions and acknowledgement of understanding and PMHx      Post Location: PACU                        Additional Info:Patient transferred to PACU. VSS. Report given to RN.                                       Last OR Temp: Temperature: 36 C (96.8 F)  ABG:  POTASSIUM   Date Value Ref Range Status   08/31/2022 4.1 3.5 - 5.1 mmol/L Final     CALCIUM   Date Value Ref Range Status   08/31/2022 10.2 8.6 - 10.3 mg/dL Final     Comment:     Gadolinium-containing contrast can interfere with calcium measurement.     Airway:* No LDAs found *  Blood pressure (!) 149/80, pulse 90, temperature 36 C (96.8 F), resp. rate (!) 10, height 1.73 m (5' 8.11"), weight 81.7 kg (180 lb 1.9 oz), SpO2 100%.

## 2022-09-01 NOTE — Discharge Instructions (Signed)
SURGICAL DISCHARGE INSTRUCTIONS     Dr. Stark Jock, MD  performed your RECONSTRUCTION EYELID today at the [facility]    [facility]:  Monday through Friday from 6 a.m. - 7 p.m.: [phone]  Between 7 p.m. - 6 a.m., weekends and holidays:  Call [facility] at [phone] or [phone].    PLEASE SEE WRITTEN HANDOUTS AS DISCUSSED BY YOUR NURSE:      SIGNS AND SYMPTOMS OF A WOUND / INCISION INFECTION   Be sure to watch for the following:  Increase in redness or red streaks near or around the wound or incision.  Increase in pain that is intense or severe and cannot be relieved by the pain medication that your doctor has given you.  Increase in swelling that cannot be relieved by elevation of a body part, or by applying ice, if permitted.  Increase in drainage, or if yellow / green in color and smells bad. This could be on a dressing or a cast.  Increase in fever for longer than 24 hours, or an increase that is higher than 101 degrees Fahrenheit (normal body temperature is 98 degrees Fahrenheit). The incision may feel warm to the touch.    **CALL YOUR DOCTOR IF ONE OR MORE OF THESE SIGNS / SYMPTOMS SHOULD OCCUR.    ANESTHESIA INFORMATION   ANESTHESIA -- ADULT PATIENTS:  You have received intravenous sedation / general anesthesia, and you may feel drowsy and light-headed for several hours. You may even experience some forgetfulness of the procedure. DO NOT DRIVE A MOTOR VEHICLE or perform any activity requiring complete alertness or coordination until you feel fully awake in about 24-48 hours. Do not drink alcoholic beverages for at least 24 hours. Do not stay alone, you must have a responsible adult available to be with you. You may also experience a dry mouth or nausea for 24 hours. This is a normal side effect and will disappear as the effects of the medication wear off.    REMEMBER   If you experience any difficulty breathing, chest pain, bleeding that you feel is excessive,  persistent nausea or vomiting or for any other concerns:  Call your physician Dr. Su Hoff at [phone] or [phone]. You may also ask to have the EYE doctor on call paged. They are available to you 24 hours a day.    SPECIAL INSTRUCTIONS / COMMENTS   See attachments    FOLLOW-UP APPOINTMENTS   Please call patient services at [phone] or [phone] to schedule a date / time of return. They are open Monday - Friday from 7:30 am - 5:00 pm.

## 2022-09-01 NOTE — Anesthesia Postprocedure Evaluation (Signed)
Anesthesia Post Op Evaluation    Patient: Travis Brooks  Procedure(s):  RECONSTRUCTION EYELID  TARRSORRAPHY    Last Vitals:Temperature: 36.5 C (97.7 F) (09/01/22 1545)  Heart Rate: 80 (09/01/22 1600)  BP (Non-Invasive): 130/77 (09/01/22 1600)  Respiratory Rate: 13 (09/01/22 1600)  SpO2: 98 % (09/01/22 1600)    No notable events documented.    Patient is sufficiently recovered from the effects of anesthesia to participate in the evaluation and has returned to their pre-procedure level.  Patient location during evaluation: PACU       Patient participation: complete - patient participated  Level of consciousness: awake and alert and responsive to verbal stimuli    Pain management: adequate  Airway patency: patent    Anesthetic complications: no  Cardiovascular status: acceptable  Respiratory status: acceptable  Hydration status: acceptable  Patient post-procedure temperature: Pt Normothermic   PONV Status: Absent

## 2022-09-01 NOTE — Brief Op Note (Signed)
[facility]  DEPARTMENT OF OPHTHALMOLOGY - BRIEF OP-NOTE    PATIENT NAME:  MORIS BUCKER NAME:  [id number]  DATE OF SERVICE:  09/01/2022  DATE OF BIRTH:  08-28-1955    PREOPERATIVE DIAGNOSIS:  Post-Mohs Right Lower Lid Defect, 23x26mm  BCCA  POSTOPERATIVE DIAGNOSIS:  same    NAME OF PROCEDURE:  Harvest of tarsoconjunctival flap RUL  Reconstruction Right Lower Lid with tarsoconjunctival flap and myocutaneous advancement flap  Temporary tarsorraphy R    SURGEON:  Stark Jock  ASSISTANT:  [name], MD; Nanci Pina, MD    ANESTHESIA:  General    ESTIMATED BLOOD LOSS:  <10cc    COMPLICATIONS:  none    DISPOSITION:  Home on self care      Nanci Pina, MD 09/01/2022, 15:16

## 2022-09-01 NOTE — Anesthesia Preprocedure Evaluation (Signed)
ANESTHESIA PRE-OP EVALUATION  Planned Procedure: RECONSTRUCTION EYELID (Right)  Review of Systems     anesthesia history negative               Pulmonary   sleep apnea,   Cardiovascular    Hypertension and hyperlipidemia , Exercise Tolerance: > or = 4 METS   ,beta blocker therapy  ,taken in last 24 hours     GI/Hepatic/Renal           Endo/Other    osteoarthritis and diverticulitis,      Neuro/Psych/MS    back abnormality     Cancer                  Physical Assessment      Airway       Mallampati: II    TM distance: >3 FB    Neck ROM: full  Mouth Opening: good.  Facial hair          Dental       Dentition intact             Pulmonary    Breath sounds clear to auscultation       Cardiovascular    Rhythm: regular  Rate: Normal       Other findings          Plan  ASA 3     Planned anesthesia type: general                         Intravenous induction     Anesthesia issues/risks discussed are: PONV, Cardiac Events/MI, Intraoperative Awareness/ Recall, Blood Loss, Sore Throat, Difficult Airway, Aspiration, Eye /Visual Loss, Dental Injuries and Post-op Intubation/Ventilation.  Anesthetic plan and risks discussed with patient  signed consent obtained      Use of blood products discussed with patient who consented to blood products.      Patient's NPO status is appropriate for Anesthesia.           Plan discussed with CRNA.

## 2022-09-01 NOTE — OR Surgeon (Signed)
[facility]  DEPARTMENT OF OPHTHALMOLOGY - OP-NOTE      PATIENT NAME:  Travis Brooks NAME:  [id number]  DATE OF SERVICE:  09/01/2022  DATE OF BIRTH:  28-Nov-1955      PREOPERATIVE DIAGNOSIS:  Post-Mohs Right Lower Lid Defect , 23x33mm  BCCA  POSTOPERATIVE DIAGNOSIS:  same     NAME OF PROCEDURE:  Harvest of tarsoconjunctival flap RUL  Reconstruction Right Lower Lid with tarsoconjunctival flap and myocutaneous advancement flap  Temporary tarsorraphy R  right lateral canthoplasty    Surgeon:  Stark Jock, MD    Fellow:  [name], MD    Resident:  Nanci Pina, MD; Rich Brave    ANESTHESIA:  General, MAC    ESTIMATED BLOOD LOSS:  < 10 cc    COMPLICATIONS:  none    SPECIMENS:  * No specimens in log *      IMPLANTS:  * No implants in log *      DESCRIPTION OF THE PROCEDURE:   After written informed consent was obtained, the patient was brought to the operating theatre.  The sites of surgery were verified.  Local anesthetic was infiltrated in the marked areas.  A drop of proparacaine was placed in the right eye.  The face was prepped and draped in the usual sterile fashion.    The defect in the right lower lid was carefully examined and measured to be approximately 23 mm x 6 mm. Decision was made to perform a tarsoconjunctival flap repair of the right lower lid defect. The upper lid was everted using a chalazion clamp. A caliper was used to measure 4mm form the lid margin and markings were made using a pen. A #15 blade was used to cut the tarsus horizontally at the 4mm mark for a total length of approximately 20mm. The mueller and levator muscle was dissected off the tarsus using Westcott scissors, and relaxing incision made at medial and lateral ends of the tarsal flap.     The eye shield was removed, and the flap was then advanced inferior and secured with 5-0 vicryl suture to the lateral tarsus first, and then medial tarsus. Several additional 5-0 vicryl sutures were used to  additionally fasten at the inferior conjunctiva. Stevens tenotomy scissor and Adson forcep were used to dissect the skin and orbicularis muscles from the septum.  Wide dissection was made 2cm from the wound edge to relax and to advance the myocutanous flaps.      Skin incision was made with #15 blade 10 mm laterally to the lateral canthus. A redundant skin flap on the inferior wound margin was excised. The SMAS was developed and advanced to the periosteum with 4-0 vicryl sutures.  The orbicularis for the inferior wound margin was plicated towards the superior wound edge. 6-0 Vicryl was used to reform the lateral canthus.  The lateral skin wound was closed with running 5-0 Monocryl suture.     5-0 vicryl was then used to attached the superior edge of myocutaneous flap to the advanced tarsoconjunctival flap at the superior edge of the the tarsal edge, and 5-0 Fast gut was then used to attached the lower eyelid skin to advanced conjunctiva in a running fashion.     A temporary tarsorrhaphy was then fashioned using a dual armed 5-0 nylon sutures and a bolster was fashioned from the soft foam. The suture was run through the superior lid and exited out at the meibomian gland line at the edge margin,  and then reinserted in to the lower lid at the meibomian gland line and exited out of the inferior lid at similar depth and distance away from the superior lid. This was repeated with the other end of the suture. The suture was then fastened down over the bolster. Erythromycin was placed on the wound and a patch was placed over the eye.     Nanci Pina, MD 09/01/2022, 15:23        I was present for all key and/or critical portions of the case and immediately available at all times.      Stark Jock, MD 09/02/2022, 12:29

## 2022-09-01 NOTE — H&P (Signed)
[facility]  DEPARTMENT OF OPHTHALMOLOGY - H&P UPDATE FORM    PATIENT NAME:  Travis Brooks NAME:  [id number]  DATE OF SERVICE:  09/01/2022  DATE OF BIRTH:  06-07-1955    Outpatient Pre-surgical H&P updated the day of procedure.    1.  H&P (completed within 30 days of surgical procedure by Dr. Christell Constant on 08/31/2022) reviewed.    [x]  H&P assessment remains unchanged based on completion of re-assessment    [ ]  H&P assessment updated as follows:    [ ]  Respiratory    [ ]  Cardiovascular    [ ]  GI    [ ]  Neuro    [ ]  Change in meds no    Comments      2.  Patient continues to be appropriate candidate for planned surgical procedure.  yes      [name], MD 09/01/2022, 11:38

## 2022-09-02 ENCOUNTER — Ambulatory Visit (HOSPITAL_BASED_OUTPATIENT_CLINIC_OR_DEPARTMENT_OTHER): Payer: Medicare PPO

## 2022-09-02 ENCOUNTER — Other Ambulatory Visit: Payer: Self-pay

## 2022-09-02 ENCOUNTER — Ambulatory Visit (INDEPENDENT_AMBULATORY_CARE_PROVIDER_SITE_OTHER): Payer: Medicare PPO | Attending: Otolaryngology | Admitting: Otolaryngology

## 2022-09-02 ENCOUNTER — Encounter (INDEPENDENT_AMBULATORY_CARE_PROVIDER_SITE_OTHER): Payer: Self-pay | Admitting: Otolaryngology

## 2022-09-02 VITALS — HR 104 | Temp 97.9°F | Ht 68.0 in | Wt 183.6 lb

## 2022-09-02 DIAGNOSIS — Z9889 Other specified postprocedural states: Secondary | ICD-10-CM

## 2022-09-02 DIAGNOSIS — C4402 Squamous cell carcinoma of skin of lip: Secondary | ICD-10-CM | POA: Insufficient documentation

## 2022-09-02 NOTE — Progress Notes (Signed)
PATIENT NAME:  Travis Brooks  MRN:  [id number]  DOB:  [birth year]  DATE OF SERVICE: 09/02/2022    Chief Complaint:  Skin Cancer      HPI:  Travis Brooks is a 67 y.o. male who is seen at the request of Dr. 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    He first noted an ulceration in his mid lower lip approximately 3 months ago.  This is not healed.  He had biopsy performed by Dr. Everitt Amber on 08/10/2022 showed squamous cell carcinoma, well differentiated.  He denies any problems with odynophagia dysphagia or weight loss.  Does not smoke cigarettes.  He has history of a moderate-large amount of sun exposure.  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  He recently underwent Mohs surgery and then had the eyelid defect repaired by Dr.Nguyen in the operating room yesterday.  He mentioned that he feels a scratching in his right eye when he opens and closes his eyelids.  He is currently wearing an eye patch covering the right eye.      Past Medical History:  Past Medical History:   Diagnosis Date    Allergic rhinitis 20years ago    Diverticulitis     Hearing loss 9 years ago    Hyperlipidemia     Hypertension     Osteoarthritis     Sleep apnea            Past Surgical History:  Past Surgical History:   Procedure Laterality Date    COLON SURGERY      HX BACK SURGERY      HX HERNIA REPAIR      HX HIP REPLACEMENT Right     HX OTHER  03/22           Family History:  Family Medical History:       Problem Relation (Age of Onset)    Hypertension (High Blood Pressure) Mother              Social History:  Social History     Tobacco Use   Smoking Status Never   Smokeless Tobacco Never     Social History     Substance and Sexual Activity   Alcohol Use Never     Social History     Occupational History    Not on file       Medications:  Outpatient Medications Marked as Taking for the 09/02/22 encounter  (Office Visit) with Charmaine Downs, MD   Medication Sig    amLODIPine (NORVASC) 5 mg Oral Tablet Take 1 Tablet (5 mg total) by mouth Once a day    aspirin (ECOTRIN) 81 mg Oral Tablet, Delayed Release (E.C.) Take 1 Tablet (81 mg total) by mouth Once a day    doxycycline hyclate (VIBRAMYCIN) 100 mg Oral Capsule Take 1 Capsule (100 mg total) by mouth Twice daily Start taking 2 days before surgery on a full stomach with full glass of water.    erythromycin (ROMYCIN) 5 mg/gram (0.5 %) Ophthalmic Ointment Apply to right eye Four times a day Apply to right eye at lid crease 4x/day    losartan (COZAAR) 50 mg Oral Tablet Take 1 Tablet (50 mg total) by mouth Once a day    magnesium oxide (MAG-OX) 400 mg Oral Tablet Take 1 Tablet (400 mg total) by mouth Every evening  metoprolol succinate (TOPROL-XL) 50 mg Oral Tablet Sustained Release 24 hr Take 1 Tablet (50 mg total) by mouth Once a day    MV with Min-Lycopene-Lutein (CENTRUM SILVER) 0.4 mg-300 mcg- 250 mcg Oral Tablet Take 1 Tablet by mouth Once a day    potassium chloride (K-DUR) 20 mEq Oral Tab Sust.Rel. Particle/Crystal Take 1 Tablet (20 mEq total) by mouth Once a day    rosuvastatin (CRESTOR) 10 mg Oral Tablet Take 1 Tablet (10 mg total) by mouth Once a day       Allergies:  No Known Allergies    Review of Systems:  Do you have any fevers: (P) no   Any weight change: (P) no   Change in your vision: (P) no    Chest Pain: (P) no   Shortness of Breath: (P) no   Stomach pain: (P) no   Urinary difficulity: (P) no   Joint Pain: (P) no   Skin Problems: (P) no   Weakness or Numbness: (P) no   Easy Bruising or Bleeding: (P) no   Excessive Thirst: (P) no   Seasonal Allergies: (P) yes    All other systems reviewed and found to be negative.    Physical Exam:  Pulse (!) 104, temperature 36.6 C (97.9 F), height 1.727 m ( ), weight 83.3 kg (183 lb 10.3 oz).  Body mass index is 27.92 kg/m.  General Appearance: Pleasant, cooperative, healthy, and in no acute distress.  Eyes:   Eye patch covers the right eye.  On the left, Conjunctivae/cornea is clear, vision is grossly intact.  Head and Face: Normocephalic, atraumatic.  Face symmetric, no obvious lesions.   Pinnae: Normal shape and position.     Nose:  External pyramid midline. Septum midline. Mucosa normal. No purulence, polyps, or crusts.     Oral Cavity:  He is noted to have 1 x 1.5 cm slightly raised area with central ulceration of the midline lower lip vermilion.  Palpation shows that this skin lesion appears very superficial.  He has diffuse actinic cheilitis along the vermilion border measuring proximally 2/3-3/4 of the lower lip.  No other mucosal lesions noted.  Oropharynx: no masses, or pharyngeal asymmetry.    Hypopharynx/Larynx:  voice normal.  Neck:  No palpable thyroid, salivary gland, or neck masses.  Heme/Lymph:  No cervical adenopathy.  Cardiovascular:  Good perfusion of upper extremities.  No cyanosis of the hands or fingers.  Lungs: No apparent stridorous breathing. No acute distress.  Skin: Skin warm and dry.  Neurologic: Cranial nerves:  grossly intact.  Psychiatric:  Alert and oriented x 3.         Data Reviewed:  Path report (08/10/2022)    Assessment:  Assessment/Plan   1. SCC (squamous cell carcinoma), lip    Diffuse actinic cheilitis.    This is likely squamous cell carcinoma arising from the actinic keratoses involving the vermilion of the lower lip.    Plan:  Recommend excisional biopsy with frozen section margins followed by lip advancement (roll) using MAC anesthesia in the operating.  I described the nature of the procedure in detail and consent was obtained.  We will plan on doing this in the near future.      Charmaine Downs, MD 09/02/2022, 15:03    PCP:  Jacalyn Lefevre, MD  [address]  [facility] [id number]   REF:  Cecilio Asper, MD  [address] [place],  [place] [address]

## 2022-09-02 NOTE — Progress Notes (Signed)
[facility]  [address]  [place] [address]  Operated by [facility]    Oculoplastics & Orbital Surgery Note    09/02/2022    Patient Name: Travis Brooks    Date of Birth:  1955/08/09    Referring Provider:  No referring provider defined for this encounter.    Ophthalmologist/Optometrist:       Past Ocular history:   Past Surgical History:   Procedure Laterality Date    COLON SURGERY      COLONOSCOPY N/A 07/22/2022    Performed by Fidela Juneau, MD at [facility] OR T&D    HX BACK SURGERY      HX HERNIA REPAIR      HX HIP REPLACEMENT Right     HX OTHER  03/22    RECONSTRUCTION EYELID Right 09/01/2022    Performed by Stark Jock, MD at [facility] OR 2 WEST    RIGHT TOTAL HIP ARTHROPLASTY USING NONCEMENTED ACCOLADE II IMPLANTS Right 08/13/2021    Performed by Alexia Freestone, MD at [facility] OR MAIN    TARRSORRAPHY Right 09/01/2022    Performed by Stark Jock, MD at [facility] OR 2 WEST         [name], MD 09/02/2022, 11:24    MD Addition to HPI:    The patient is a 67 y.o. male here with complaint of:    Presenting for post op check after [facility] flap OD after Mohs excision of BCC RLL.  Feels sharp pulling over his right eye.      Assessment     Impression(s):      ICD-10-CM    1. Post-operative state  Z98.890           Plan(s)/Recommendation(s):    POD1 s/p Hughes flap, lateral canthoplasty, temp tarsorrhaphy OD (09/01/22) after Mohs for BCC RLL  - patch removed today, healing well, minimal bleeding, tarsorrhaphy bolsters in place  - symptoms likely from tarsorrhaphy sutures pulling; however cannot remove bolsters until next week  - recommend starting ice packs, ointment over lids  - eye shield at night  - RTC post op 4/23 as scheduled      Imaging (CT/MRI) Reviewed:    Results Reviewed:         I saw and examined the patient.  I reviewed the technician/resident's note.  I agree with the findings and plan of care as documented in the resident's note.  Any exceptions/additions are  edited/noted.  Stark Jock, MD 09/02/2022  11:24        I personally performed the services described in this documentation, as scribed  in my presence, and it is both accurate  and complete.  Stark Jock, MD 09/02/2022  11:24s      Base Eye Exam       Neuro/Psych       Oriented x3: Yes    Mood/Affect: Normal                  Slit Lamp and Fundus Exam       External Exam         Right Left    External Edema, Ecchymosis Normal              Slit Lamp Exam         Right Left    Lids/Lashes Tarsorrhaphy bolsters in place Dermatochalasis - upper lid    Conjunctiva/Sclera White and quiet White and quiet    Cornea Clear Clear  Anterior Chamber Deep and quiet Deep and quiet    Iris Round and reactive Round and reactive    Lens Trace Nuclear sclerosis Trace Nuclear sclerosis                        I have seen and examined the above patient. I discussed the above diagnoses listed in the assessment and the above ophthalmic plan of care with the patient and patient's family. All questions were answered. I reviewed and, when necessary, made changes to the technician/resident note, documented ophthalmology exam, chief complaint, history of present illness, allergies, review of systems, past medical, past surgical, family and social history.    Stark Jock, MD 09/02/2022  11:24

## 2022-09-04 DIAGNOSIS — Z01818 Encounter for other preprocedural examination: Secondary | ICD-10-CM

## 2022-09-04 LAB — ECG 12-LEAD (PERFORMED IN PREADMISSION UNIT ONLY)
Atrial Rate: 69 {beats}/min
Calculated R Axis: 123 degrees
Calculated T Axis: 123 degrees
PR Interval: 150 ms
QRS Duration: 100 ms
QT Interval: 364 ms
QTC Calculation: 390 ms
Ventricular rate: 69 {beats}/min

## 2022-09-05 ENCOUNTER — Telehealth (HOSPITAL_BASED_OUTPATIENT_CLINIC_OR_DEPARTMENT_OTHER): Payer: Self-pay | Admitting: MOHS-Micrographic Surgery

## 2022-09-05 NOTE — Telephone Encounter (Signed)
-----   Message from Synetta Shadow sent at 09/05/2022  9:40 AM EDT -----  Dr. Everitt Amber,    Mr. Guidice called regarding his antibiotic. He was given doxycycline bid x 14 days.  On the day of his procedure, cephalexin qid x 7 days was called in.  He is wanting to know if he discontinues one and begins the other or does he take them at the same time.      His number is [phone].    Thanks,    [employer]

## 2022-09-05 NOTE — Telephone Encounter (Signed)
[id number]- Attempted to return patient's call, no answer. Left voicemail to call clinic at earliest convenience.    Mechele Claude, RN   09/05/2022 09:50

## 2022-09-06 ENCOUNTER — Encounter (INDEPENDENT_AMBULATORY_CARE_PROVIDER_SITE_OTHER): Payer: Self-pay

## 2022-09-06 ENCOUNTER — Encounter (HOSPITAL_COMMUNITY): Payer: Self-pay

## 2022-09-06 ENCOUNTER — Encounter (HOSPITAL_COMMUNITY): Payer: Self-pay | Admitting: Otolaryngology

## 2022-09-06 ENCOUNTER — Inpatient Hospital Stay (HOSPITAL_COMMUNITY)
Admission: RE | Admit: 2022-09-06 | Discharge: 2022-09-06 | Disposition: A | Discharge status: Home / Self Care | Payer: Medicare PPO | Source: Ambulatory Visit

## 2022-09-06 HISTORY — DX: Presence of spectacles and contact lenses: Z97.3

## 2022-09-06 HISTORY — DX: Dependence on other enabling machines and devices: Z99.89

## 2022-09-06 HISTORY — DX: Malignant (primary) neoplasm, unspecified: C80.1

## 2022-09-06 HISTORY — DX: Personal history of other diseases of the nervous system and sense organs: Z86.69

## 2022-09-06 HISTORY — DX: Gastro-esophageal reflux disease without esophagitis: K21.9

## 2022-09-06 NOTE — Nursing Note (Addendum)
Your patient is scheduled for: Wide excision lower lip, local flap placement lower lip on 09/15/2022  The patient is on the following medication(s) with the following plan:    Aspirin; duration to hold antiplatelet- hold  x7 days; with earliest anticipated restart time TBD-day of procedure.    Note  - Earliest anticipated restart times are for planning purposes only.  Surgeon/proceduralist will confirm all restarting plans post procedure.    Please reply to this message with your preference.

## 2022-09-06 NOTE — Nursing Note (Signed)
Unable to fax clearance request to PCP. Attempted 3x, will not go through. Spoke to patient, he stated that he spoke to his PCP's office this morning and he was advised to hold aspirin for 5 days pre-op.

## 2022-09-08 ENCOUNTER — Ambulatory Visit (INDEPENDENT_AMBULATORY_CARE_PROVIDER_SITE_OTHER): Payer: Self-pay | Admitting: Ophthalmology

## 2022-09-08 NOTE — Telephone Encounter (Signed)
Regarding: Travis Brooks  ----- Message from Crist Infante Raber sent at 09/08/2022 11:15 AM EDT -----  Pt calling and wanted to get a call about his procedure follow up that is to be done on 4.23.24 He wants to know what will be happening. He has an ENT app on 4.25.24. He is to have something removed off his lip. Please call back to advise what all will be done. Thank you!!!

## 2022-09-08 NOTE — Telephone Encounter (Signed)
Spoke w/ Pt. Cancelled appt for 09/13/22. Pt agreed for Dr Cyndie Chime to stop by on 09/15/22 while he is having sx w/ Dr Jerral Ralph. Pt had some concerns about some sutures that are causing discomfort. Discussed ung. Pt asked about the antibiotic change from Doxy to Keflex. Pt notes that he had CDIF from an antibiotic before and didn't want the same thing to happen. Told pt I would reach out to provider for advise.  Diona Foley, COT 09/08/2022 13:55

## 2022-09-08 NOTE — Telephone Encounter (Signed)
Message sent to provider for advise.  Diona Foley, COT 09/08/2022 12:42

## 2022-09-09 NOTE — Telephone Encounter (Signed)
Per Dr Regino Schultze. "There is a risk with many antibiotics, so we can't guarantee any particular antibiotic won't give him c diff. Suggest taking probiotics while on antibiotics to lower risk.  I can send Bactrim or azithromycin as an alternative, there is a lower chance of c diff with those.  But switching antibiotics frequently also carries risk of developing resistance."  Information relayed to PT and emergency # given.   Diona Foley, COT 09/09/2022 08:33

## 2022-09-13 ENCOUNTER — Ambulatory Visit (INDEPENDENT_AMBULATORY_CARE_PROVIDER_SITE_OTHER): Payer: Self-pay | Admitting: Ophthalmology

## 2022-09-15 ENCOUNTER — Ambulatory Visit (HOSPITAL_COMMUNITY): Payer: Medicare PPO

## 2022-09-15 ENCOUNTER — Encounter (INDEPENDENT_AMBULATORY_CARE_PROVIDER_SITE_OTHER): Payer: Self-pay | Admitting: Ophthalmology

## 2022-09-15 ENCOUNTER — Inpatient Hospital Stay
Admission: RE | Admit: 2022-09-15 | Discharge: 2022-09-15 | Disposition: A | Discharge status: Home / Self Care | Payer: Medicare PPO | Source: Ambulatory Visit | Attending: Otolaryngology | Admitting: Otolaryngology

## 2022-09-15 ENCOUNTER — Encounter (HOSPITAL_COMMUNITY)
Admission: RE | Disposition: A | Discharge status: Home / Self Care | Payer: Self-pay | Source: Ambulatory Visit | Attending: Otolaryngology

## 2022-09-15 ENCOUNTER — Other Ambulatory Visit: Payer: Self-pay

## 2022-09-15 ENCOUNTER — Encounter (HOSPITAL_BASED_OUTPATIENT_CLINIC_OR_DEPARTMENT_OTHER): Payer: Medicare PPO | Admitting: Ophthalmology

## 2022-09-15 ENCOUNTER — Ambulatory Visit (INDEPENDENT_AMBULATORY_CARE_PROVIDER_SITE_OTHER): Payer: Self-pay | Admitting: Ophthalmology

## 2022-09-15 ENCOUNTER — Encounter (HOSPITAL_COMMUNITY): Payer: Medicare PPO | Admitting: Otolaryngology

## 2022-09-15 DIAGNOSIS — K219 Gastro-esophageal reflux disease without esophagitis: Secondary | ICD-10-CM | POA: Insufficient documentation

## 2022-09-15 DIAGNOSIS — G4730 Sleep apnea, unspecified: Secondary | ICD-10-CM | POA: Insufficient documentation

## 2022-09-15 DIAGNOSIS — Z85828 Personal history of other malignant neoplasm of skin: Secondary | ICD-10-CM | POA: Insufficient documentation

## 2022-09-15 DIAGNOSIS — Z9889 Other specified postprocedural states: Secondary | ICD-10-CM

## 2022-09-15 DIAGNOSIS — C441122 Basal cell carcinoma of skin of right lower eyelid, including canthus: Secondary | ICD-10-CM

## 2022-09-15 DIAGNOSIS — E785 Hyperlipidemia, unspecified: Secondary | ICD-10-CM | POA: Insufficient documentation

## 2022-09-15 DIAGNOSIS — C001 Malignant neoplasm of external lower lip: Secondary | ICD-10-CM

## 2022-09-15 DIAGNOSIS — I10 Essential (primary) hypertension: Secondary | ICD-10-CM | POA: Insufficient documentation

## 2022-09-15 DIAGNOSIS — L568 Other specified acute skin changes due to ultraviolet radiation: Secondary | ICD-10-CM

## 2022-09-15 SURGERY — EXCISION LESION LIP SQUAMOUS CELL CARCINOMA
Anesthesia: Monitor Anesthesia Care | Site: Face | Wound class: Clean Contaminated Wounds-The respiratory, GI, Genital, or urinary

## 2022-09-15 MED ORDER — FENTANYL (PF) 50 MCG/ML INJECTION SOLUTION
12.5000 ug | INTRAMUSCULAR | Status: DC | PRN
Start: 2022-09-15 — End: 2022-09-15

## 2022-09-15 MED ORDER — DROPERIDOL 2.5 MG/ML INJECTION SOLUTION
0.6250 mg | Freq: Once | INTRAMUSCULAR | Status: DC | PRN
Start: 2022-09-15 — End: 2022-09-15

## 2022-09-15 MED ORDER — KETAMINE 10 MG/ML INJECTION WRAPPER
INTRAMUSCULAR | Status: AC
Start: 2022-09-15 — End: 2022-09-15
  Filled 2022-09-15: qty 5

## 2022-09-15 MED ORDER — SODIUM CHLORIDE 0.9 % INTRAVENOUS SOLUTION
150.0000 mg | Freq: Once | INTRAVENOUS | Status: AC
Start: 2022-09-15 — End: 2022-09-15
  Administered 2022-09-15: 150 mg via INTRAVENOUS
  Filled 2022-09-15: qty 5

## 2022-09-15 MED ORDER — SODIUM CHLORIDE 0.9% FLUSH BAG - 250 ML
INTRAVENOUS | Status: DC | PRN
Start: 2022-09-15 — End: 2022-09-15

## 2022-09-15 MED ORDER — AMOXICILLIN 875 MG-POTASSIUM CLAVULANATE 125 MG TABLET
1.0000 | ORAL_TABLET | Freq: Two times a day (BID) | ORAL | 0 refills | Status: AC
Start: 2022-09-15 — End: 2022-09-22
  Filled 2022-09-15: qty 14, 7d supply, fill #0

## 2022-09-15 MED ORDER — BACITRACIN 500 UNIT/G OINTMENT TUBE
TOPICAL_OINTMENT | Freq: Once | CUTANEOUS | Status: DC | PRN
Start: 2022-09-15 — End: 2022-09-15

## 2022-09-15 MED ORDER — ONDANSETRON HCL (PF) 4 MG/2 ML INJECTION SOLUTION
INTRAMUSCULAR | Status: AC
Start: 2022-09-15 — End: 2022-09-15
  Filled 2022-09-15: qty 2

## 2022-09-15 MED ORDER — PROPOFOL 10 MG/ML IV BOLUS
INJECTION | Freq: Once | INTRAVENOUS | Status: DC | PRN
Start: 2022-09-15 — End: 2022-09-15
  Administered 2022-09-15: 50 mg via INTRAVENOUS

## 2022-09-15 MED ORDER — BACITRACIN 500 UNIT/G OINTMENT TUBE
TOPICAL_OINTMENT | Freq: Two times a day (BID) | CUTANEOUS | 0 refills | Status: AC
Start: 2022-09-15 — End: 2022-09-22
  Filled 2022-09-15: qty 28.4, 7d supply, fill #0

## 2022-09-15 MED ORDER — PROCHLORPERAZINE EDISYLATE 10 MG/2 ML (5 MG/ML) INJECTION SOLUTION
5.0000 mg | Freq: Once | INTRAMUSCULAR | Status: DC | PRN
Start: 2022-09-15 — End: 2022-09-15

## 2022-09-15 MED ORDER — MIDAZOLAM 1 MG/ML INJECTION SOLUTION
INTRAMUSCULAR | Status: AC
Start: 2022-09-15 — End: 2022-09-15
  Filled 2022-09-15: qty 2

## 2022-09-15 MED ORDER — HYDRALAZINE 20 MG/ML INJECTION SOLUTION
10.0000 mg | Freq: Once | INTRAMUSCULAR | Status: DC | PRN
Start: 2022-09-15 — End: 2022-09-15

## 2022-09-15 MED ORDER — NALOXONE 4 MG/ACTUATION NASAL SPRAY
1.0000 | NASAL | 0 refills | Status: DC | PRN
Start: 2022-09-15 — End: 2022-12-23
  Filled 2022-09-15: qty 2, 1d supply, fill #0

## 2022-09-15 MED ORDER — LACTATED RINGERS INTRAVENOUS SOLUTION
INTRAVENOUS | Status: DC
Start: 2022-09-15 — End: 2022-09-15

## 2022-09-15 MED ORDER — PROPOFOL 10 MG/ML INTRAVENOUS EMULSION
INTRAVENOUS | Status: DC | PRN
Start: 2022-09-15 — End: 2022-09-15
  Administered 2022-09-15: 100 ug/kg/min via INTRAVENOUS
  Administered 2022-09-15: 50 ug/kg/min via INTRAVENOUS
  Administered 2022-09-15: 0 ug/kg/min via INTRAVENOUS
  Administered 2022-09-15: 25 ug/kg/min via INTRAVENOUS

## 2022-09-15 MED ORDER — LIDOCAINE 1 %-EPINEPHRINE 1:100,000 INJECTION SOLUTION
5.0000 mL | Freq: Once | INTRAMUSCULAR | Status: DC | PRN
Start: 2022-09-15 — End: 2022-09-15
  Administered 2022-09-15: 2 mL via INTRAMUSCULAR
  Administered 2022-09-15: 3 mL via INTRAMUSCULAR
  Administered 2022-09-15: 13 mL via INTRAMUSCULAR
  Filled 2022-09-15: qty 5

## 2022-09-15 MED ORDER — KETAMINE 50 MG/ML INJECTION SOLUTION
Freq: Once | INTRAMUSCULAR | Status: DC | PRN
Start: 2022-09-15 — End: 2022-09-15
  Administered 2022-09-15 (×3): 10 mg via INTRAVENOUS

## 2022-09-15 MED ORDER — CEFAZOLIN 10 GRAM SOLUTION FOR INJECTION
2.0000 g | Freq: Once | INTRAMUSCULAR | Status: AC
Start: 2022-09-15 — End: 2022-09-15
  Administered 2022-09-15: 2 g via INTRAVENOUS
  Filled 2022-09-15: qty 10

## 2022-09-15 MED ORDER — SODIUM CHLORIDE 0.9 % (FLUSH) INJECTION SYRINGE
2.0000 mL | INJECTION | Freq: Three times a day (TID) | INTRAMUSCULAR | Status: DC
Start: 2022-09-15 — End: 2022-09-15

## 2022-09-15 MED ORDER — ONDANSETRON HCL (PF) 4 MG/2 ML INJECTION SOLUTION
Freq: Once | INTRAMUSCULAR | Status: DC | PRN
Start: 2022-09-15 — End: 2022-09-15
  Administered 2022-09-15: 4 mg via INTRAVENOUS

## 2022-09-15 MED ORDER — LIDOCAINE (PF) 20 MG/ML (2 %) INJECTION SOLUTION
INTRAMUSCULAR | Status: AC
Start: 2022-09-15 — End: 2022-09-15
  Filled 2022-09-15: qty 5

## 2022-09-15 MED ORDER — MIDAZOLAM (PF) 1 MG/ML INJECTION SOLUTION
Freq: Once | INTRAMUSCULAR | Status: DC | PRN
Start: 2022-09-15 — End: 2022-09-15
  Administered 2022-09-15: 2 mg via INTRAVENOUS

## 2022-09-15 MED ORDER — FENTANYL (PF) 50 MCG/ML INJECTION SOLUTION
INTRAMUSCULAR | Status: AC
Start: 2022-09-15 — End: 2022-09-15
  Filled 2022-09-15: qty 2

## 2022-09-15 MED ORDER — SODIUM CHLORIDE 0.9 % (FLUSH) INJECTION SYRINGE
2.0000 mL | INJECTION | INTRAMUSCULAR | Status: DC | PRN
Start: 2022-09-15 — End: 2022-09-15

## 2022-09-15 MED ORDER — METOPROLOL TARTRATE 5 MG/5 ML INTRAVENOUS SOLUTION
Freq: Once | INTRAVENOUS | Status: DC | PRN
Start: 2022-09-15 — End: 2022-09-15
  Administered 2022-09-15 (×2): 2 mg via INTRAVENOUS

## 2022-09-15 MED ORDER — ACETAMINOPHEN 1,000 MG/100 ML (10 MG/ML) INTRAVENOUS SOLUTION
Freq: Once | INTRAVENOUS | Status: DC | PRN
Start: 2022-09-15 — End: 2022-09-15
  Administered 2022-09-15: 1000 mg via INTRAVENOUS

## 2022-09-15 MED ORDER — HYDROCODONE 7.5 MG-ACETAMINOPHEN 325 MG/15 ML ORAL SOLUTION
7.5000 mg | Freq: Once | ORAL | Status: AC
Start: 2022-09-15 — End: 2022-09-15
  Administered 2022-09-15: 7.5 mg via ORAL
  Filled 2022-09-15: qty 15

## 2022-09-15 MED ORDER — FENTANYL (PF) 50 MCG/ML INJECTION SOLUTION
Freq: Once | INTRAMUSCULAR | Status: DC | PRN
Start: 2022-09-15 — End: 2022-09-15
  Administered 2022-09-15 (×4): 25 ug via INTRAVENOUS

## 2022-09-15 MED ORDER — DEXTROSE 5% IN WATER (D5W) FLUSH BAG - 250 ML
INTRAVENOUS | Status: DC | PRN
Start: 2022-09-15 — End: 2022-09-15

## 2022-09-15 MED ORDER — DEXMEDETOMIDINE 4 MCG/ML IV DILUTION
Freq: Once | INTRAMUSCULAR | Status: DC | PRN
Start: 2022-09-15 — End: 2022-09-15
  Administered 2022-09-15: 12 ug via INTRAVENOUS
  Administered 2022-09-15: 8 ug via INTRAVENOUS

## 2022-09-15 MED ORDER — FENTANYL (PF) 50 MCG/ML INJECTION SOLUTION
25.0000 ug | INTRAMUSCULAR | Status: DC | PRN
Start: 2022-09-15 — End: 2022-09-15

## 2022-09-15 MED ORDER — HYDROCODONE 7.5 MG-ACETAMINOPHEN 325 MG/15 ML ORAL SOLUTION
7.5000 mg | Freq: Four times a day (QID) | ORAL | 0 refills | Status: DC | PRN
Start: 2022-09-15 — End: 2023-12-19
  Filled 2022-09-15: qty 180, 3d supply, fill #0

## 2022-09-15 MED ORDER — METOPROLOL TARTRATE 5 MG/5 ML INTRAVENOUS SOLUTION
INTRAVENOUS | Status: AC
Start: 2022-09-15 — End: 2022-09-15
  Filled 2022-09-15: qty 5

## 2022-09-15 SURGICAL SUPPLY — 15 items
APPL FBRTP 6IN STRL LTX COTTON WD QTP ABS TIP NONLINT PRECNT (MED SURG SUPPLIES) ×4 IMPLANT
CONV USE 23866 - NEEDLE HYPO 27GA 1.5IN STD MONOJECT SS POLYPROP REG BVL LL HUB UL SHRP ANTICORE YW STRL LF  DISP (MED SURG SUPPLIES) ×2 IMPLANT
CORD BIPOLAR 12IN SILVERGLIDE STR CABLE STRL LF  DISP (ENDOSCOPIC SUPPLIES) ×2 IMPLANT
COVER WND RF DETECT STRL CLR EQP (DRAPE/PACKS/SHEETS/OR TOWEL) ×2 IMPLANT
CUP MED 2OZ PLASTIC GRAD STRL LF  DISP (MED SURG SUPPLIES) IMPLANT
DISCONTINUED USE 338665 - PACK SURG ENT STRL DISP LTX (CUSTOM TRAYS & PACK) ×2 IMPLANT
DISCONTINUED USE ITEM 35894 - DRAPE MAG 16X10IN DVN FOAM FLXB 121 STRL LF  DISP (DRAPE/PACKS/SHEETS/OR TOWEL) IMPLANT
DRAPE MAG 16X10IN DVN FOAM FLXB 121 STRL LF  DISP (DRAPE/PACKS/SHEETS/OR TOWEL)
DRAPE REINF FNFLD 90X44IN LF  STRL DISP SURG (DRAPE/PACKS/SHEETS/OR TOWEL) IMPLANT
ELECTRODE ESURG BLADE PNCL 10FT VLAB STRL SS DISP BUTTON SWH HEX LOCK CORD HLSTR LF  ACPT 3/32IN STD (SURGICAL CUTTING SUPPLIES) IMPLANT
PACK SURG ENT STRL DISP LTX (CUSTOM TRAYS & PACK) ×2
PEN SURG MRKNG DVN SKIN DISP FN TIP FLXB RLR CAP LBL GNTN VIOL STRL LF (MED SURG SUPPLIES) ×2 IMPLANT
SYRINGE LL 10ML LF  STRL CONTROL CONCEN TIP PRGN FREE DEHP-FR MED DISP (MED SURG SUPPLIES) ×2 IMPLANT
TAPE MICROPORE 1/2IN BRN 24/BX_15330 (WOUND CARE SUPPLY) IMPLANT
TRAY SKIN SCRUB 8IN VNYL COTTON 6 WNG 6 SPONGE STICK 2 TIP APPL DRY STRL LF (MED SURG SUPPLIES) IMPLANT

## 2022-09-15 NOTE — Anesthesia Transfer of Care (Signed)
ANESTHESIA TRANSFER OF CARE   Travis Brooks is a 67 y.o. ,male, Weight: 80.6 kg (177 lb 11.1 oz)   had Procedure(s):  EXCISION LESION LIP SQUAMOUS CELL CARCINOMA  PLACEMENT FLAP LOCAL  performed  09/15/22   Primary Service: Charmaine Downs, MD    Past Medical History:   Diagnosis Date   . Allergic rhinitis 20years ago   . Cancer (CMS HCC)     SCCA   . CPAP (continuous positive airway pressure) dependence    . Diverticulitis    . Esophageal reflux     controlled with medication   . H/O hearing loss     bilat hearing aids   . Hearing loss 9 years ago   . Hyperlipidemia     controlled with medication   . Hypertension     controlled with medication   . Osteoarthritis    . Sleep apnea    . Wears glasses       Allergy History as of 09/15/22        No Known Allergies                  I completed my transfer of care / handoff to the receiving personnel during which we discussed:  Access, Airway, All key/critical aspects of case discussed, Analgesia, Antibiotics, Expectation of post procedure, Fluids/Product, Gave opportunity for questions and acknowledgement of understanding, Labs and PMHx      Post Location: Phase II                        Additional Info:Report given to PACU RN. VS stable, airway patent. Transported pt RA.                                   Last OR Temp: Temperature: 36.7 C (98.1 F)  ABG:  POTASSIUM   Date Value Ref Range Status   08/31/2022 4.1 3.5 - 5.1 mmol/L Final     CALCIUM   Date Value Ref Range Status   08/31/2022 10.2 8.6 - 10.3 mg/dL Final     Comment:     Gadolinium-containing contrast can interfere with calcium measurement.     Calculated R Axis   Date Value Ref Range Status   08/31/2022 123 degrees Final     Calculated T Axis   Date Value Ref Range Status   08/31/2022 123 degrees Final     Airway:* No LDAs found *  Blood pressure 124/67, pulse 65, temperature 36.7 C (98.1 F), resp. rate (!) 10, height 1.727 m (5\' 8" ), weight 80.6 kg (177 lb 11.1 oz), SpO2 94%.

## 2022-09-15 NOTE — OR Surgeon (Signed)
[facility]  DEPARTMENT OF OTOLARYNGOLOGY  OPERATIVE REPORT    PATIENT NAME:  Travis Brooks 67 y.o. male  MRN: [id number]  DOB:  [birth year]  DATE OF SERVICE:  09/15/2022    PRE-/POST-OPERATIVE DIAGNOSES:  Lower Lip midline SCC    NAME OF PROCEDURES:  Excision of lower lip SCC and actinic cheilitis involving vermillion border.  Mucosal advancement flap (lip roll) for closure of lip defect, > 10 sqcm, < 30 sqcm    SURGEONS:  Lucianne Muss, MD (Resident ); Marilynne Halsted, MD (Resident); Blenda Nicely, MD (Staff Surgeon).    ANESTHESIA:  MAC anesthesia.    ESTIMATED BLOOD LOSS:  Less than 50 ml    OPERATIVE FINDINGS:  Midline lower lip lesion w/ negative margins following final resection.     IMPLANTS:  * No implants in log *    SPECIMENS:    ID Type Source Tests Collected by Time Destination   1 : Lower lip vermillion; long stitch lateral, 9 oclock; short stitch mucosa 12 oclock Tissue Lip SURGICAL PATHOLOGY SPECIMEN Charmaine Downs, MD 09/15/2022 1135    2 : lower lip re-excision 12 to 12:30, stitch at 12 o'clock Tissue Lip SURGICAL PATHOLOGY SPECIMEN Charmaine Downs, MD 09/15/2022 1236    3 : Lower lip re-excision 12:30 to 1:30, stitch at 12:30 Tissue Lip SURGICAL PATHOLOGY SPECIMEN Charmaine Downs, MD 09/15/2022 1240    4 : Lower lip re-excision 1:30 to 3:00, stitch at 1:30 Tissue Lip SURGICAL PATHOLOGY SPECIMEN Charmaine Downs, MD 09/15/2022 1241        COMPLICATIONS:  None.    INDICATIONS FOR PROCEDURE:  This is a 67 y.o. male seen and evaluated in the clinic and found to have a lower lip SCC. It was felt at that time that the patient would be a good candidate to undergo the above procedure.     DESCRIPTION OF PROCEDURE:  After ensuring we had appropriate informed consent and patient identification in the preoperative holding area, the patient was escorted back to the operating room by anesthesia.  Once in the operating room, the patient was sedated with MAC anesthesia without incident. Once given the go  ahead by anesthesia, the table was turned 90 degrees.  The patient was prepped and draped in the usual sterile fashion for intraoral surgery.     The patient was anesthetized with bilateral mental nerve blocks using 1% lidocaine with epinephrine 1:100,000.  After the patient was adequately anesthetized, he local anesthetic was also used to numb the lower lip from the mentum on the anterior skin around the red lip into the mucosal surface/gingival buccal sulcus.  After giving the patient enough time to be adequately anesthetized and to promote vasoconstriction, the outline of the anticipated resection was marked using marking pen.  Using a 15 blade, the incision was made superficially around the lesion and the actinic chelitis involving nearly the entire lower red lip vermillion border, from oral commissure to oral commissure.  After the incision was taken through the mucosal and epidermal surface, the dissection was carried deeper to the muscular layer using tenotomy scissors.  Using a combination of blunt dissection and tenotomy scissors, the ellipse of lower lip skin and mucosa was elevated off of the underlying muscular layer.  A wedge resection of the mucosa of the midline was taken to ensure circumferential margins around the midline lip lesion.  Meeting in the midline, the specimen was truncated from the underlying muscle and sent for intraoperative frozen  sections.    A combination of blunt dissection and tenotomy scissors was then used to elevate the skin anterior to the excision site in the subdermal plane, as well as extension intraorally in the submucosal plane.  Tacking sutures were put in place to approximate the position of the mucosal advancement while awaiting intraoperative frozen sections.  Initial frozen sections yielded a positive margin in the left half of the mucosal border of the resection.  The tacking sutures were removed and additional margins were taken, which were negative.    The mucosal  wedge was then reapproximated in a deep dermal plane using buried interrupted 4-0 Vicryl sutures.  The mucosal surface intraorally was then reapproximated using 4-0 chromic suture.  The deep dermal layer along the length of the lip was then closed using buried interrupted 4-0 Vicryl suture.  The submucosal/dermal layer was then closed in a buried interrupted fashion using 5-0 Vicryl suture.  Finally the superficial layer was closed using interrupted and running 5-0 nylon suture.  The incision line was generously coated with a layer of bacitracin.    The patient was then turned back over to anesthesia, allowed to wake up, and taken to the postoperative recovery area in stable condition.  Dr. Ardean Larsen was present for the key and critical portions of the procedure.    PLEASE DISCHARGE PATIENT TO HOME POSTOP IF MEETS [facility] CRITERIA.    Lucianne Muss, MD

## 2022-09-15 NOTE — Telephone Encounter (Signed)
Message sent to MD. Shyane Fossum L Diego Delancey, RN

## 2022-09-15 NOTE — H&P (Signed)
[facility]  OTOLARYNGOLOGY DEPARTMENT  H&P Note    Date: 09/15/2022  Name: Travis Brooks, 67 y.o. male  MRN: [id number]  DOB: 02/16/1956    HPI:   Travis Brooks is a 67 y.o. 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 Since this last clinic visit, he denies any changes in symptoms, surgical history, medications, and allergies. Patient has not had any recent illnesses and is currently at baseline state of health.     Past Medical History:   Diagnosis Date    Allergic rhinitis 20years ago    Cancer (CMS HCC)     SCCA    CPAP (continuous positive airway pressure) dependence     Diverticulitis     Esophageal reflux     controlled with medication    H/O hearing loss     bilat hearing aids    Hearing loss 9 years ago    Hyperlipidemia     controlled with medication    Hypertension     controlled with medication    Osteoarthritis     Sleep apnea     Wears glasses      Past Surgical History:   Procedure Laterality Date    Colon surgery      Colonoscopy      Hx back surgery  2022    Hx hernia repair  2016    Hx hip replacement Right 07/2020    Lateral canthoplasty Right 09/01/2022     Social History     Socioeconomic History    Marital status: Married   Tobacco Use    Smoking status: Never    Smokeless tobacco: Never   Vaping Use    Vaping status: Never Used   Substance and Sexual Activity    Alcohol use: Never    Drug use: Never   Other Topics Concern    Ability to Walk 1 Flight of Steps without SOB/CP Yes    Routine Exercise Yes     Comment: walks daily and outside activities    Ability to Walk 2 Flight of Steps without SOB/CP Yes    Ability To Do Own ADL's Yes    Uses Walker No    Uses Cane No     No Known Allergies  Family Medical History:       Problem Relation (Age of Onset)    Hypertension (High Blood Pressure) Mother            Current Outpatient Medications   Medication Sig    amLODIPine (NORVASC)  5 mg Oral Tablet Take 1 Tablet (5 mg total) by mouth Once a day Takes in am    aspirin (ECOTRIN) 81 mg Oral Tablet, Delayed Release (E.C.) Take 1 Tablet (81 mg total) by mouth Once a day Takes in am    cephalexin (KEFLEX) 500 mg Oral Capsule Take 1 Capsule (500 mg total) by mouth Four times a day For 7 days    doxycycline hyclate (VIBRAMYCIN) 100 mg Oral Capsule Take 1 Capsule (100 mg total) by mouth Twice daily Start taking 2 days before surgery on a full stomach with full glass of water. (Patient not taking: Reported on 09/06/2022)    erythromycin (ROMYCIN) 5 mg/gram (0.5 %) Ophthalmic Ointment Apply to right eye Four times a day Apply to right eye at lid crease 4x/day    lansoprazole (PREVACID ORAL) Take 1 Tablet by mouth Once per  day as needed    losartan (COZAAR) 50 mg Oral Tablet Take 1 Tablet (50 mg total) by mouth Once a day Takes in evening    magnesium oxide (MAG-OX) 400 mg Oral Tablet Take 1 Tablet (400 mg total) by mouth Every evening    metoprolol succinate (TOPROL-XL) 50 mg Oral Tablet Sustained Release 24 hr Take 1 Tablet (50 mg total) by mouth Once a day Takes in am    MV with Min-Lycopene-Lutein (CENTRUM SILVER) 0.4 mg-300 mcg- 250 mcg Oral Tablet Take 1 Tablet by mouth Once a day    polyvinyl alcohol-povidone (REFRESH) 1.4-0.6 % Ophthalmic Dropperette Instill 1 Drop into both eyes Twice daily (Patient not taking: Reported on 08/25/2022)    potassium chloride (K-DUR) 20 mEq Oral Tab Sust.Rel. Particle/Crystal Take 1 Tablet (20 mEq total) by mouth Once a day Takes in evening    rosuvastatin (CRESTOR) 10 mg Oral Tablet Take 1 Tablet (10 mg total) by mouth Once a day Takes in evening       Review of Systems: Denies fevers or chills. All other systems reviewed and were found to be negative.    Physical Examination: No acute distress.  No respiratory distress no stridor.  No cyanosis of the upper extremities.  No epistaxis.  No otorrhea.  Moves all extremities.    Otolaryngology-Head and Neck Surgery  Specific Comorbid Conditions  - Will monitor comorbid conditions while inpatient and consult appropriate services as necessary.   - For complete past medical, surgical and social histories, please see relevant sections in H&P.   - Additional comorbid conditions include:       Past Medical History:   Diagnosis Date    Allergic rhinitis 20years ago    Cancer (CMS HCC)     SCCA    CPAP (continuous positive airway pressure) dependence     Diverticulitis     Esophageal reflux     controlled with medication    H/O hearing loss     bilat hearing aids    Hearing loss 9 years ago    Hyperlipidemia     controlled with medication    Hypertension     controlled with medication    Osteoarthritis     Sleep apnea     Wears glasses            Pertinent labs and clinical data reviewed:   Nutritional status:  No height and weight on file for this encounter.   There is no height or weight on file to calculate BMI.     Patient weight not recorded  No results for input(s): "ALBUMIN", "PREALBUMIN" in the last 4383 hours. Hepatic function:  No results for input(s): "TOTPROTEIN", "TOTBILIRUBIN", "AST", "ALT", "ALKPHOS", "GAMMAGT", "LDH", "AMYLASE", "LIPASE", "AMMONIA" in the last 4383 hours.   CBC and Coag function:  No results for input(s): "WBC", "HGB", "HCT", "PLTCNT", "APTT", "INR" in the last 4383 hours.  BMP and Renal function:  Recent Labs   Labs - Last 6 Months 08/31/22  1635   SODIUM 140   POTASSIUM 4.1   CHLORIDE 106   CALCIUM 10.2   BUN 18   CREATININE 1.23   GFR 64            Pre-Surgical H & P updated the day of the procedure.  1.  H&P the patient has been examined, and no change has occurred in the patients condition since the H&P was completed.      Change in medications: No  Comments: None    2.  Patient continues to be appropiate candidate for planned surgical procedure. YES.     Lucianne Muss, MD    Late entry for 09/15/22. I saw and examined the patient.  I reviewed the resident's note.  I agree with the findings and  plan of care as documented in the resident's note.  Any exceptions/additions are edited/noted.    Charmaine Downs, MD

## 2022-09-15 NOTE — Anesthesia Preprocedure Evaluation (Addendum)
ANESTHESIA PRE-OP EVALUATION  Planned Procedure: EXCISION LESION LIP SQUAMOUS CELL CARCINOMA  PLACEMENT FLAP LOCAL (Face)  Review of Systems                   Pulmonary   sleep apnea,   Cardiovascular    Hypertension and hyperlipidemia ,       GI/Hepatic/Renal    GERD        Endo/Other         Neuro/Psych/MS        Cancer  CA,                       Physical Assessment      Airway       Mallampati: II    TM distance: >3 FB    Neck ROM: full  Mouth Opening: good.  No Facial hair  No Beard        Dental           (+) poor dentition           Pulmonary    Breath sounds clear to auscultation       Cardiovascular    Rhythm: regular  Rate: Normal       Other findings  Right eye stitches removed by surgeon preop. Eye closed, irritated, and watering.            Plan  ASA 2     Planned anesthesia type: MAC                         Intravenous induction     Anesthesia issues/risks discussed are: PONV, Cardiac Events/MI, Stroke, Intraoperative Awareness/ Recall and Blood Loss.  Anesthetic plan and risks discussed with patient  signed consent obtained      Use of blood products discussed with patient who consented to blood products.      Patient's NPO status is appropriate for Anesthesia.           Plan discussed with CRNA.

## 2022-09-15 NOTE — Discharge Instructions (Signed)
SURGICAL DISCHARGE INSTRUCTIONS     Dr. Charmaine Downs, MD  performed your EXCISION LESION LIP SQUAMOUS CELL CARCINOMA, PLACEMENT FLAP LOCAL today at the [facility]    [facility]:  Monday through Friday from 6 a.m. - 7 p.m.: [phone]  Between 7 p.m. - 6 a.m., weekends and holidays:  Call [facility] at [phone] or [phone].    PLEASE SEE WRITTEN HANDOUTS AS DISCUSSED BY YOUR NURSE:     SIGNS AND SYMPTOMS OF A WOUND / INCISION INFECTION   Be sure to watch for the following:  Increase in redness or red streaks near or around the wound or incision.  Increase in pain that is intense or severe and cannot be relieved by the pain medication that your doctor has given you.  Increase in swelling that cannot be relieved by elevation of a body part, or by applying ice, if permitted.  Increase in drainage, or if yellow / green in color and smells bad. This could be on a dressing or a cast.  Increase in fever for longer than 24 hours, or an increase that is higher than 101 degrees Fahrenheit (normal body temperature is 98 degrees Fahrenheit). The incision may feel warm to the touch.    **CALL YOUR DOCTOR IF ONE OR MORE OF THESE SIGNS / SYMPTOMS SHOULD OCCUR.    ANESTHESIA INFORMATION   LOCAL ANESTHETIC:  You have receieved a local anesthetic, the effects should disappear in a few hours.  REMEMBER   If you experience any difficulty breathing, chest pain, bleeding that you feel is excessive, persistent nausea or vomiting or for any other concerns:  Call your physician Dr. Ardean Larsen at [phone] or [phone]. You may also ask to have the Otolaryngology doctor on call paged. They are available to you 24 hours a day.    SPECIAL INSTRUCTIONS / COMMENTS   See handout    FOLLOW-UP APPOINTMENTS   Please call patient services at [phone] or [phone] to schedule a date / time of return. They are open Monday - Friday from 7:30 am - 5:00 pm.

## 2022-09-15 NOTE — Progress Notes (Signed)
S:  67yo man c hx of BCCA excision with Dr. Everitt Amber and reconstruction RLL with [facility] tarsoconjunctival flap 09/01/2022.  Here for lip lesion excision with Dr. Ardean Larsen.  Less pain OD, still having tearing.    O:  well healed tarsorrphy R  Skin flap, lower and upper lids in good position    A/P:  Well healed stage I Hughes  Removed tarsorraphy  Reviewed r/b/a of stage II under local    SURGERY    Procedure:  RLL stage II reconstruction under local - 2nd half of May or early June - minor OR    Diagnosis:     ICD-10-CM    1. Basal cell carcinoma (BCC) of skin of right lower eyelid including canthus  C44.1122           Anesthesia: LOCAL    Length of time: 30 minutes    Special Needs:    Risks, benefits, and alternatives explained.    Pt understands and elects to proceed with surgery/desires for surgery    Stark Jock, MD 09/15/2022 11:02

## 2022-09-15 NOTE — Nurses Notes (Signed)
Patient discharged home with family.  AVS reviewed with patient/care giver.  A written copy of the AVS and discharge instructions was given to the patient/care giver. Scripts handed to patient/care giver. Questions sufficiently answered as needed.  Patient/care giver encouraged to follow up with PCP as indicated.  In the event of an emergency, patient/care giver instructed to call 911 or go to the nearest emergency room.      Leeroy Lovings, RN

## 2022-09-15 NOTE — Progress Notes (Signed)
Oculoplastics & Orbital Surgery Note    09/15/2022    Patient Name: Travis Brooks    Date of Birth:  25-Apr-1956      MD Addition to HPI:    The patient is a 67 y.o. male,   POD12 s/p Hughes flap, lateral canthoplasty, temp tarsorrhaphy OD (09/01/22) after Mohs for BCC RLL.  Post op check performed today while pt in preop area for ENT procedure.  Pt overall feeling well, minimal swelling no pain over R eyelid      Past Ocular history:   Past Surgical History:   Procedure Laterality Date    COLON SURGERY      COLONOSCOPY      COLONOSCOPY N/A 07/22/2022    Performed by Fidela Juneau, MD at [facility] OR T&D    HX BACK SURGERY  2022    HX HERNIA REPAIR  2016    x2    HX HIP REPLACEMENT Right 07/2020    LATERAL CANTHOPLASTY Right 09/01/2022    RECONSTRUCTION EYELID Right 09/01/2022    Performed by Stark Jock, MD at [facility] OR 2 WEST    RIGHT TOTAL HIP ARTHROPLASTY USING NONCEMENTED ACCOLADE II IMPLANTS Right 08/13/2021    Performed by Alexia Freestone, MD at [facility] OR MAIN    TARRSORRAPHY Right 09/01/2022    Performed by Stark Jock, MD at [facility] OR 2 WEST       Exam:    R temporary tarsorrhaphy bolsters in place, minimal swelling. Removal of bolsters reveal healthy skin flap and covered globe    Assessment     Impression(s):    Post op eye    Plan(s)/Recommendation(s):      POD12 s/p Hughes flap, lateral canthoplasty, temp tarsorrhaphy OD (09/01/22) after Mohs for BCC RLL     - tarsorrhaphy bolsters removed  - consent obtained for second stage Hughes flap for minor OR in mid-May  - continue Ats and ointment    SURGERY    Procedure: Second stage Hughes (in minor OR, mid-May)    Diagnosis: Post operative state        ICD-10-CM     1. Basal cell carcinoma (BCC) of skin of right lower eyelid including canthus  C44.1122 OPH EXTERNAL PHOTOS       CANCELED: OPH EXTERNAL PHOTOS       Anesthesia: LOCAL    Length of time: 30 minutes    Special Needs:    Implants:      I saw and examined the patient.  I reviewed the technician/resident's note.   I agree with the findings and plan of care as documented in the resident's note.  Any exceptions/additions are edited/noted.  Stark Jock, MD 09/15/2022  10:34        I personally performed the services described in this documentation, as scribed  in my presence, and it is both accurate  and complete.  Stark Jock, MD 09/15/2022  10:34      Not recorded             I have seen and examined the above patient. I discussed the above diagnoses listed in the assessment and the above ophthalmic plan of care with the patient and patient's family. All questions were answered. I reviewed and, when necessary, made changes to the technician/resident note, documented ophthalmology exam, chief complaint, history of present illness, allergies, review of systems, past medical, past surgical, family and social history.    Stark Jock, MD 09/15/2022  10:34

## 2022-09-15 NOTE — Telephone Encounter (Signed)
Spoke to pt. Advised per Dr. Cyndie Chime. Audry Pili, RN      Per Dr. Cyndie Chime:  He does not need to take it for [place] anymore

## 2022-09-15 NOTE — Telephone Encounter (Signed)
Regarding: Dr. Cyndie Chime  ----- Message from Konrad Felix sent at 09/14/2022  3:30 PM EDT -----  Dr. Cyndie Chime  Patient is asking that you call him in regards to medication cephalexin, he said he cannot tolerate it, he can only take it 2 x per day without getting sick and even taking it 2 times he does not feel good. Please call him at [phone]   Disp Refills Start End   cephalexin (KEFLEX) 500 mg Oral Capsule - -  -   Sig - Route: Take 1 Capsule (500 mg total) by mouth Four times a day For 7 days - Oral

## 2022-09-15 NOTE — Anesthesia Postprocedure Evaluation (Signed)
Anesthesia Post Op Evaluation    Patient: Travis Brooks  Procedure(s):  EXCISION LESION LIP SQUAMOUS CELL CARCINOMA  PLACEMENT FLAP LOCAL    Last Vitals:Temperature: 36.7 C (98.1 F) (09/15/22 1351)  Heart Rate: 65 (09/15/22 1351)  BP (Non-Invasive): 124/67 (09/15/22 1351)  Respiratory Rate: (!) 10 (09/15/22 1351)  SpO2: 94 % (09/15/22 1351)    No notable events documented.    Patient is sufficiently recovered from the effects of anesthesia to participate in the evaluation and has returned to their pre-procedure level.  Patient location during evaluation: PACU       Patient participation: complete - patient participated  Level of consciousness: awake and alert and responsive to verbal stimuli    Pain management: adequate  Airway patency: patent    Anesthetic complications: no  Cardiovascular status: acceptable and hemodynamically stable  Respiratory status: acceptable and nonlabored ventilation  Hydration status: acceptable  Patient post-procedure temperature: Pt Normothermic   PONV Status: Absent

## 2022-09-16 ENCOUNTER — Encounter (HOSPITAL_BASED_OUTPATIENT_CLINIC_OR_DEPARTMENT_OTHER): Payer: Self-pay | Admitting: MOHS-Micrographic Surgery

## 2022-09-16 DIAGNOSIS — C001 Malignant neoplasm of external lower lip: Secondary | ICD-10-CM

## 2022-09-16 LAB — SURGICAL PATHOLOGY SPECIMEN
ADDITIONAL DIMENSION IN CENTIMETERS (CM): 0.4
ADDITIONAL DIMENSION IN CENTIMETERS (CM): 0.8
SPECIMEN MARGIN STATUS FOR INVASIVE TUMOR: NEGATIVE

## 2022-09-16 NOTE — Progress Notes (Signed)
[facility]  [address]  [place] [address]  Operated by [facility]     Name: Travis Brooks MRN:  [id number]   Date: 09/01/2022 Age: 67 y.o.     Mohs Day of Surgery Checklist:    No Known Allergies    Current Outpatient Medications   Medication Sig Dispense Refill    amoxicillin-pot clavulanate (AUGMENTIN) 875-125 mg Oral Tablet Take 1 Tablet by mouth Twice daily for 7 days 14 Tablet 0    aspirin (ECOTRIN) 81 mg Oral Tablet, Delayed Release (E.C.) Take 1 Tablet (81 mg total) by mouth Once a day Takes in am      bacitracin 500 unit/gram Ointment Apply topically Twice daily for 7 days 28.4 g 0    erythromycin (ROMYCIN) 5 mg/gram (0.5 %) Ophthalmic Ointment Apply to right eye Four times a day Apply to right eye at lid crease 4x/day 3.5 g 2    HYDROcodone-acetaminophen (LORTAB) 7.5-325 mg per 15 mL oral liquid Take 15 mL (7.5 mg total) by mouth Every 6 hours as needed for up to 12 doses 180 mL 0    lansoprazole (PREVACID ORAL) Take 1 Tablet by mouth Once per day as needed      losartan (COZAAR) 50 mg Oral Tablet Take 1 Tablet (50 mg total) by mouth Once a day Takes in evening      magnesium oxide (MAG-OX) 400 mg Oral Tablet Take 1 Tablet (400 mg total) by mouth Every evening      metoprolol succinate (TOPROL-XL) 50 mg Oral Tablet Sustained Release 24 hr Take 1 Tablet (50 mg total) by mouth Once a day Takes in am      MV with Min-Lycopene-Lutein (CENTRUM SILVER) 0.4 mg-300 mcg- 250 mcg Oral Tablet Take 1 Tablet by mouth Once a day      naloxone (NARCAN) 4 mg per spray nasal spray Administer 1 Spray by INTRANASAL route (alternating nostrils) Every 2 minutes as needed for actual or suspected opioid overdose. Call 911 if given. 2 Each 0    potassium chloride (K-DUR) 20 mEq Oral Tab Sust.Rel. Particle/Crystal Take 1 Tablet (20 mEq total) by mouth Once a day Takes in evening      rosuvastatin (CRESTOR) 10 mg Oral Tablet Take 1 Tablet (10 mg total) by mouth Once a  day Takes in evening       No current facility-administered medications for this visit.       Brief Physical Exam:  BP (!) 175/92 (Patient Position: Sitting)   Pulse 76   Temp 36.8 C (98.2 F)   Resp 18   Wt 81.7 kg (180 lb 3.2 oz)   SpO2 97%   BMI 27.31 kg/m       Lymph Nodes: no lymph nodes palpable in cervical neck and supraclavicular areas  Skin: scar at biopsy site on RIGHT LOWER EYELID     MOHS OPERATIVE REPORT    Testing Laboratory:  [facility]  1st Floor Mohs Surgery Lab  [address]  [place], [place] [address]    Date(s):  09/01/2022    Patient:  Travis Brooks                                                  [facility]#: [id number]  Faculty Surgeon: Cecilio Asper, M.D., Ph.D.  _____________________________________________________________________    Location: RIGHT LOWER EYELID     Preoperative Diagnosis:  basal cell carcinoma    Postoperative Diagnosis: basal cell carcinoma    Procedure: Mohs Micrographic Surgery     Pretreatment Size:  2.3 cm x 0.7 cm       Stage: 1  Blocks: 1  Tumor seen: no  Depth of Invasion: no tumor seen  Scar: no  PNI: no    Final Wound Size: 2.4 cm x 1.2 cm    Mohs Map:      Anesthesia: 8.2 ml lidocaine 1% w/epinephrine 1:100,000        Estimated Blood Loss:  minimal    Complications: none     Mohs Appropriate Use Criteria (AUC):  7 (Lesion is appropriate for treatment by Mohs Surgery)  _____________________________________________________________________    After a full discussion with the patient, who reviewed the options for further management, an informed consent for surgery was obtained.  The patient identified the tumor site, the tumor was marked with a sterile surgical marking pen and the location was reconfirmed with the patient.     The patient was brought to the operating room and prepped and draped in the usual manner and a time out was performed.  The tissue surrounding the tumor was infiltrated with local  anesthesia and a curette was used to remove the gross tumor.  The Mohs Stage I tissue specimen layer was resected and hemostasis was achieved through the use of the electrocautery.  The tissue specimen and corresponding skin adjacent to the defect were marked with hashes using a scalpel. The hashes were colored with ink of different colors as shown in the Mohs Map included herein. The tissue was cut to display en-face margins on slides that were subsequently stained with hematoxylin and eosin and examined under the microscope. The Mohs Surgeon, Dr. Cecilio Asper MD PhD, personally examined the histopathology slides and performed the Mohs surgery, acting as both the surgeon and the pathologist providing the histologic evaluation of the specimen. A dressing was applied and this sequence was repeated until surgery was completed and negative margins were obtained.  Following completion of the Mohs micrographic surgery a full discussion with the patient addressed all of the alternative options for further wound management.  This included the option of no further treatment.     Mohs Procedure Progress Note:    Travis Brooks is a 67 y.o. male here for scheduled Mohs Micrographic Surgery.  Consent reviewed. Sterile prep with alcohol. Anesthesia with 1% lidocaine w/epinephrine 1:100,000. Negative margins obtained using Mohs Micrographic Surgery techniques in 1 stage. Discussed reconstructive options. Patient and physicians agree that Oculoplastic Repair is the best reconstructive option.    Instructions for dressing changes and management of postoperative bleeding were discussed. Technique for holding pressure for post-operative bleeding was demonstrated.    Verbal postoperative wound care instructions were given and a wound care hand out was provided. The patient was also told to call us at anytime for signs of wound infection or any other concerns they might have regarding the surgery or the healing process. The patient  tolerated the procedure well.    Cecilio Asper MD, PhD  Mohs Micrographic Surgery  Department of Dermatology

## 2022-09-20 ENCOUNTER — Ambulatory Visit (INDEPENDENT_AMBULATORY_CARE_PROVIDER_SITE_OTHER): Payer: Self-pay | Admitting: Ophthalmology

## 2022-09-21 ENCOUNTER — Ambulatory Visit (INDEPENDENT_AMBULATORY_CARE_PROVIDER_SITE_OTHER): Payer: Self-pay | Admitting: Otolaryngology

## 2022-09-21 ENCOUNTER — Ambulatory Visit (INDEPENDENT_AMBULATORY_CARE_PROVIDER_SITE_OTHER): Payer: Self-pay | Admitting: Ophthalmology

## 2022-09-21 NOTE — Telephone Encounter (Signed)
Regarding: Travis Brooks  ----- Message from [place] Raber sent at 09/21/2022  1:41 PM EDT -----  Pt calling and since he had surgery he was to put the gel on his eye he can not remember how long he is to use this or if he needs to stop. Please call back to advise, Thank you!!

## 2022-09-21 NOTE — Telephone Encounter (Signed)
Message sent to MD. Terence Bart L Reigan Tolliver, RN

## 2022-09-21 NOTE — Telephone Encounter (Signed)
Spoke to patient, asking about bacitracin ointment, states that he was given bacitracin to use for 7 days. Patient asked if he needs to use it past today, advised that no he does not. Patient states that he is unable to tolerate the Augmentin . Patient states that he was only able to take it for 4 days, took it on a full stomach, with yogurt and had noted some blood in his stool this morning. Patient has a history of part of his colon being resected due to diverticulitis, and also has a history of cdiff. Patient notes cramping around his waist line since starting the antibiotic as well. Patient states that his stool is just soft, not liquid and there was not a lot of blood, just enough he could tell what it was. I advised to stop the Augmentin, will check with Dr. Duanne Guess or Dr. Ardean Larsen to see if they would like him to be placed on a different antibiotic. Patient stated understanding and denies further questions      Advised pt to follow up with PCP / GI doctor in regards to bleeding given history. Will send message to Dr. Ardean Larsen to advise on abx. Pt stated understanding and denies further questions

## 2022-09-21 NOTE — Telephone Encounter (Signed)
Spoke to pt. Advised per Dr. Regino Schultze. Audry Pili, RN      Per Dr. Regino Schultze:  Nightly is fine!

## 2022-09-21 NOTE — Telephone Encounter (Signed)
Regarding: Travis Brooks  ----- Message from [place] Raber sent at 09/21/2022  1:37 PM EDT -----  Pt calling about the medication he needs to keep on the incision. He also said he wanted to know about the amoxacillin. He said he passed some blood last night and he is having a lot of discomfort in his abdomen. Can he get something else or what can he do. Please call back to advise, Thank you!!

## 2022-09-22 ENCOUNTER — Other Ambulatory Visit (HOSPITAL_COMMUNITY): Payer: Self-pay | Admitting: Family Medicine

## 2022-09-22 ENCOUNTER — Other Ambulatory Visit: Payer: Self-pay

## 2022-09-22 ENCOUNTER — Inpatient Hospital Stay
Admission: RE | Admit: 2022-09-22 | Discharge: 2022-09-22 | Disposition: A | Discharge status: Home / Self Care | Payer: Medicare PPO | Source: Ambulatory Visit | Attending: Family Medicine | Admitting: Family Medicine

## 2022-09-22 DIAGNOSIS — R059 Cough, unspecified: Secondary | ICD-10-CM

## 2022-09-23 ENCOUNTER — Encounter (INDEPENDENT_AMBULATORY_CARE_PROVIDER_SITE_OTHER): Payer: Self-pay | Admitting: Ophthalmology

## 2022-10-04 ENCOUNTER — Encounter (INDEPENDENT_AMBULATORY_CARE_PROVIDER_SITE_OTHER): Payer: Self-pay | Admitting: Ophthalmology

## 2022-10-04 LAB — DERMATOLOGIC SURGERY LABORATORY SPECIMEN: Final Diagnosis: NEGATIVE

## 2022-10-04 NOTE — Progress Notes (Signed)
I was told by the pt's insurance that procedure code 16109 is NOT a covered benefit; ref #: [id number], and the rep I spoke to was [facility]. I advised the pt to call his insurance to discuss, and gave him the reference number and rep's name. I also sent Dr. Cyndie Chime and Dr. Regino Schultze an in-basket to let them know the situation. Travis Brooks 10/04/22

## 2022-10-06 ENCOUNTER — Ambulatory Visit (INDEPENDENT_AMBULATORY_CARE_PROVIDER_SITE_OTHER): Payer: Self-pay | Admitting: Ophthalmology

## 2022-10-06 ENCOUNTER — Ambulatory Visit (INDEPENDENT_AMBULATORY_CARE_PROVIDER_SITE_OTHER): Payer: Self-pay | Admitting: Otolaryngology

## 2022-10-06 NOTE — Telephone Encounter (Signed)
Attempted to call patient. No response. Left call back number on voicemail.       Wound check

## 2022-10-06 NOTE — Telephone Encounter (Signed)
Regarding: Armeni  ----- Message from [place] sent at 10/06/2022  1:03 PM EDT -----  Pt called asking what to expect at his next appointment on 5.21

## 2022-10-06 NOTE — Telephone Encounter (Signed)
Message sent to MD for advisement. Myrtie Soman, RN  10/06/2022, 13:21

## 2022-10-06 NOTE — Telephone Encounter (Signed)
Regarding: Travis Brooks  ----- Message from [place] sent at 10/06/2022  1:07 PM EDT -----  The patient called to find out what to expect on the day of his appt 5.21.24 with Dr. Cyndie Brooks to see if it would be okay for him to go home afterwards or not. The best number to reach him is [phone]. Thank you.

## 2022-10-07 NOTE — Telephone Encounter (Signed)
Regarding: Travis Brooks  ----- Message from Eudelia Bunch sent at 10/07/2022 10:10 AM EDT -----    Patient returning phone call from yesterday 05.16.24 ,  Thank You!   ----- Message from Delynn Flavin sent at 10/06/2022  1:07 PM EDT -----  The patient called to find out what to expect on the day of his appt 5.21.24 with Dr. Cyndie Brooks to see if it would be okay for him to go home afterwards or not. The best number to reach him is [phone]. Thank you.

## 2022-10-07 NOTE — Telephone Encounter (Signed)
Returned call to patient to advise per Dr. Regino Schultze:  Travis Chick, MD    Called him but he didn't pick up. Can you let him know that it is a short simple procedure and he can go home after    Patient verbalized understanding. No further questions or concerns at this time. Myrtie Soman, RN  10/07/2022, 10:24

## 2022-10-11 ENCOUNTER — Ambulatory Visit: Payer: Medicare PPO | Attending: Ophthalmology | Admitting: Ophthalmology

## 2022-10-11 ENCOUNTER — Ambulatory Visit (INDEPENDENT_AMBULATORY_CARE_PROVIDER_SITE_OTHER): Payer: Medicare PPO | Admitting: Otolaryngology

## 2022-10-11 ENCOUNTER — Inpatient Hospital Stay (HOSPITAL_COMMUNITY)
Admission: RE | Admit: 2022-10-11 | Discharge: 2022-10-11 | Disposition: A | Discharge status: Home / Self Care | Payer: Medicare PPO | Source: Ambulatory Visit

## 2022-10-11 ENCOUNTER — Encounter (INDEPENDENT_AMBULATORY_CARE_PROVIDER_SITE_OTHER): Payer: Self-pay | Admitting: Otolaryngology

## 2022-10-11 ENCOUNTER — Other Ambulatory Visit: Payer: Self-pay

## 2022-10-11 VITALS — BP 169/85 | HR 87 | Temp 97.0°F | Ht 68.0 in | Wt 177.7 lb

## 2022-10-11 DIAGNOSIS — Z9889 Other specified postprocedural states: Secondary | ICD-10-CM

## 2022-10-11 DIAGNOSIS — C441122 Basal cell carcinoma of skin of right lower eyelid, including canthus: Secondary | ICD-10-CM | POA: Insufficient documentation

## 2022-10-11 DIAGNOSIS — C4402 Squamous cell carcinoma of skin of lip: Secondary | ICD-10-CM | POA: Insufficient documentation

## 2022-10-11 HISTORY — PX: HX EYELID SURGERY: 2100001304

## 2022-10-11 NOTE — Procedures (Signed)
[facility]  [address]  [place] [address]  Operated by [facility]  Procedure Note    Name: Travis Brooks MRN:  [id number]   Date: 10/11/2022 DOB:  1955-11-02 (66 y.o.)         Procedure    Performed by: [name], MD  Authorized by: [name], MD    Time Out:     Immediately before the procedure, a time out was called:  Yes    Patient verified:  Yes    Procedure Verified:  Yes    Site Verified:  Yes      MINOR ROOM OPERATIVE REPORT  Informed Consent Obtained?  yes  Surgical Pause:  yes  Time:  1230  Prep:  Betadine  Local Anesthesia:   6ml of 2% and Lidocaine w/ epinephrine      Procedure Note  Pre-op Diagnosis:  S/p Hughes procedure, right eye  BCCA RLL  Post-Op Diagnosis:  Same  Procedure:  Second stage Kizzie Bane procedure, right eye  Faculty Surgeon:  Stark Jock, MD  Fellow: [name] MD  Resident:  Joslyn Hy MD; Gracelyn Nurse MD    The patient was brought to the minor room and a time out procedure was performed. LMX and Akten were placed on the operative eye. After several minutes, lidocaine with epinephrine was injected into the upper and lower eyelid. The skin was cleansed with Betadine. The patient was draped in the usual sterile ophthalmic fashion.     The flap was elevated and grasped with forceps and   incised with Westcott scissors. A cotton tip applicator was used to protect the globe. The superior and inferior edges of the flap were trimmed flush with the upper lid tarsus and lower lid margin, respectively. Hemostasis was achieved using bipolar cautery. The conjunctiva was sutured to the skin flap using four interrupted 7-0 chromic sutures.     Complications:   None  Post-Op Ointment/Drop:  Erythromycin oitment     [name], MD 10/11/2022, 13:04        Post-Procedure Note  Bleeding:    with a small amount of bleeding  Patient is stable, and ready for discharge from the clinic.      [name], MD 10/11/2022, 13:04    Teaching physician was present and  supervised the entire procedure.  Stark Jock, MD 10/25/2022, 00:09

## 2022-10-11 NOTE — Nursing Note (Signed)
Assessment post procedure

## 2022-10-11 NOTE — Progress Notes (Signed)
[facility]  DEPARTMENT OF OTOLARYNGOLOGY - HEAD AND NECK SURGERY  CLINIC PROGRESS NOTE    Name: Travis Brooks, 67 y.o. male  MRN: [id number]  Date of Birth: 06-07-1955  Date of Service: 10/11/2022    Subjective: Travis Brooks is a 67 y.o. 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    Patient has some oral incompetence with thin liquids. Took antibiotics post-op but stopped early due to diarrhea. Diarrhea and C. Diff resolved. Denies any pain, drainage, or bleeding. Doing well overall.    Past Medical History:  Past Medical History:   Diagnosis Date    Allergic rhinitis 20years ago    Cancer (CMS HCC)     SCCA    CPAP (continuous positive airway pressure) dependence     Diverticulitis     Esophageal reflux     controlled with medication    H/O hearing loss     bilat hearing aids    Hearing loss 9 years ago    Hyperlipidemia     controlled with medication    Hypertension     controlled with medication    Osteoarthritis     Sleep apnea     Wears glasses          Past Surgical History:  Past Surgical History:   Procedure Laterality Date    Colon surgery      Colonoscopy      Hx back surgery  2022    Hx hernia repair  2016    Hx hip replacement Right 07/2020    Lateral canthoplasty Right 09/01/2022     Medications:  Outpatient Medications Marked as Taking for the 10/11/22 encounter (Office Visit) with Charmaine Downs, MD   Medication Sig    aspirin (ECOTRIN) 81 mg Oral Tablet, Delayed Release (E.C.) Take 1 Tablet (81 mg total) by mouth Once a day Takes in am    erythromycin (ROMYCIN) 5 mg/gram (0.5 %) Ophthalmic Ointment Apply to right eye Four times a day Apply to right eye at lid crease 4x/day    lansoprazole (PREVACID ORAL) Take 1 Tablet by mouth Once per day as needed    losartan (COZAAR) 50 mg Oral Tablet Take 1 Tablet (50 mg total) by  mouth Once a day Takes in evening    magnesium oxide (MAG-OX) 400 mg Oral Tablet Take 1 Tablet (400 mg total) by mouth Every evening    metoprolol succinate (TOPROL-XL) 50 mg Oral Tablet Sustained Release 24 hr Take 1 Tablet (50 mg total) by mouth Once a day Takes in am    MV with Min-Lycopene-Lutein (CENTRUM SILVER) 0.4 mg-300 mcg- 250 mcg Oral Tablet Take 1 Tablet by mouth Once a day    potassium chloride (K-DUR) 20 mEq Oral Tab Sust.Rel. Particle/Crystal Take 1 Tablet (20 mEq total) by mouth Once a day Takes in evening    rosuvastatin (CRESTOR) 10 mg Oral Tablet Take 1 Tablet (10 mg total) by mouth Once a day Takes in evening      Family History:  Family Medical History:       Problem Relation (Age of Onset)    Hypertension (High Blood Pressure) Mother            Social History:  Social History     Occupational History    Not on file   Tobacco Use  Smoking status: Never    Smokeless tobacco: Never   Vaping Use    Vaping status: Never Used   Substance and Sexual Activity    Alcohol use: Never    Drug use: Never    Sexual activity: Not on file     Allergies:  No Known Allergies    Review of Systems:                                                          All other systems reviewed and found to be negative.    Physical Exam:  BP (Non-Invasive): (!) 169/85 Temperature: 36.1 C (97 F) Heart Rate: 87      Height: 172.7 cm (5\' 8" ) Weight: 80.6 kg (177 lb 11.1 oz) Body mass index is 27.02 kg/m.    General Appearance: Well-appearing 67 y.o. male who is pleasant, cooperative and in no acute distress.  Head: Normocephalic.  Skin: Warm and dry.  Oral Cavity/Oropharynx: Non absorbable sutures removed from lower lip. Lower lip healing well with scattered crusting. [place] boarder with good shape.  Cardiovascular: Upper extremities are warm and well perfused, with no cyanosis of the hands or fingers.  Respiratory: No apparent stridorous breathing. No acute respiratory distress.  Musculoskeletal: Moving all  extremities.  Neurologic: Grossly normal. Cranial nerves II-XII grossly intact bilaterally.  Psychiatric:  Alert and with appropriate affect.    Review of Information:    Pathology:   Recent Results (from the past 720 hour(s))   SURGICAL PATHOLOGY SPECIMEN   Result Value    Final Diagnosis      A. 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  - Tumor size: 1 cm, depth of invasion: 0.14 cm  - Margins: see final margin part B-D; tumor is located 4 mm to the deep margin  - No perineural or lymphovascular invasion identified  - See synoptic report, pT1     B. LOWER LIP, MARGIN AT 12-12:30 , RE-EXCISION:  - Benign squamous mucosa, negative for carcinoma     C.LOWER LIP, MARGIN AT 12:30- 1:30, RE-EXCISION:  - Benign squamous mucosa, negative for carcinoma    D. LOWER LIP, MARGIN AT 1:30 - 3:00, RE-EXCISION:  - Benign squamous mucosa, negative for carcinoma      Synoptic Report      ORAL CAVITY   ORAL CAVITY - All Specimens   8th Edition - Protocol posted: 11/10/2021      SPECIMEN      Procedure:    Excision       TUMOR      Tumor Focality:    Unifocal       Tumor Site:    Oral cavity         Tumor Subsite:    Wet mucosa of lower lip: vermilion       Tumor Laterality:    Not specified       Tumor Size:    Greatest Dimension (Centimeters): 1 cm        Additional Dimension (Centimeters):    0.8 cm        Additional Dimension (Centimeters):    0.4 cm      Histologic Type:            :  Squamous cell carcinoma, conventional (keratinizing)       Histologic Grade:    G2, moderately differentiated       Tumor Depth of Invasion (DOI):    1.4 mm      Lymphatic and / or Vascular Invasion:    Not identified       Perineural Invasion:    Not identified       MARGINS      Specimen Margin Status for Invasive Tumor:    All specimen margins negative for invasive tumor         Distance from Invasive Tumor to Closest Specimen Margin:    Final  margin submitted separately (part B-D), distance can not be measured directly         Closest Specimen Margin(s) to Invasive Tumor:    12-3 o'clock direction       Specimen Margin Status for Noninvasive Tumor (High-grade Dysplasia):    All specimen margins negative for high-grade dysplasia / in situ disease       REGIONAL LYMPH NODES      Regional Lymph Node Status:    Not applicable (no regional lymph nodes submitted or found)       pTNM CLASSIFICATION (AJCC 8th Edition)      Reporting of pT, pN, and (when applicable) pM categories is based on information available to the pathologist at the time the report is issued. As per the AJCC (Chapter 1, 8th Ed.) it is the managing physician's responsibility to establish the final pathologic stage based upon all pertinent information, including but potentially not limited to this pathology report.      pT Category:    pT1       pN Category:    pN not assigned (no nodes submitted or found)       ADDITIONAL FINDINGS    Additional Findings:    Keratinizing dysplasia, moderate     Additional Findings:    Keratinizing dysplasia, severe (carcinoma in situ)       Microscopic Description      Microscopic examination performed.      Intraoperative Consult Diagnosis      A: Lip  AF1. 12-3:00 margin, en face, frozen:  - Focal squamous atypia, defer.    AF2. 3-6:00 margin, en face, frozen:  - Negative for carcinoma.    AF3. 6-9:00 margin, en face, frozen:  - Negative for carcinoma.    AF4. 9-12:00 margin, en face, frozen:  - Negative for carcinoma.    AF5. Deep margin perpendicular froze:  - Negative for carcinoma.    Collected 09/15/22 11:35 AM by Charmaine Downs, MD.  Results communicated to OR 04 with acknowledgement.  B: Lip  BF1. Lower lip re-excision 12 to 12:30, frozen:   - Negative for carcinoma.    Collected 09/15/22 12:36 PM by Charmaine Downs, MD.  Results communicated to room 04 with acknowledgement.        C: Lip  CF1. Lower lip re-excision 12:30 to 1:30, frozen:   - Negative  for carcinoma.    Collected 09/15/22 12:40 PM by Charmaine Downs, MD.  Results communicated to OR room 04 with acknowledgement.        D: Lip  DF1. Lower lip re-excision 1:30 to 3:00, stitch, frozen:   - Negative for carcinoma.    Collected 09/15/22 12:41 PM by Charmaine Downs, MD.  Results communicated to room 04 with acknowledgement.      Gross Description  A: Lip  Part A is received fresh, labeled "Kiari, Denley" and "Lower lip vermillion; long stitch lateral, 9 oclock; short stitch mucosa 12 oclock". It consists of 6.3 (3-9 o'clock direction)  x 1.9 (12-6 o'clock direction) cm excision of  tan-brown, shaggy and hemorrhagic skin and mucosa. There is a long suture designating 9:00 o'clock and a short suture designating 12:00 o'clock.    Specimen is inked as follows 12-3 o'clock edge = blue, 3-6 o'clock edge = black, 6-9 o'clock edge = orange, 9-12 o'clock edge = green, deep margin= red. Specimen is serially sectioned from 3-9 o'clock.  Sectioning of the specimen reveals a 1.0 x 0.8 x 0.4 cm nodule, at the 12:00 mucosal edge, 3.4 cm from the 3 o'clock edge, 1.9 cm from the 6 o'clock edge, 3.0 cm from the 9 o'clock.  The remainder of the cut surfaces are tan-hemorrhagic and unremarkable.  The specimen is submitted for frozen analysis with frozen remnant sections being submitted for permanent in A1-A5.   A1:  12-3:00 mucosal margin; En face  A2:  3-6:00 mucosal margin; En face  A3:  6-9:00 mucosal margin; En face  A4:  9-12:00 mucosal margin; En face  A5:  Deep margin to include nodule; perpendicular  A6-A9: Remainder of specimen (from 3-9 o'clock), entirely submitted      B: Lip  Part B is received fresh, labeled "Mensah, Gosnell" and "lower lip re-excision 12 to 12:30, stitch at 12 o'clock". It consists of an oriented, 1.4 x 0.4 cm linear portion of mucosa excised to a depth of 0.5 cm.  The stitch indicates the 12:00 aspect.  The 12:00 aspect is inked blue.  The specimen is submitted entirely en face for frozen  section analysis.  The frozen section remnant is submitted in cassette B1.    C: Lip  Part C is received fresh, labeled "Joan, Sahni" and "Lower lip re-excision 12:30 to 1:30, stitch at 12:30". It consists of oriented 1.5 x 0.5 cm tan-brown hemorrhagic excision of skin and mucosa excised to a depth of 0.4 cm.  There is a suture designating the 1230 aspect, inked blue.  Specimen is submitted En face for frozen analysis prior to being submitted for permanent in C1.   D: Lip  Part D is received fresh, labeled "Mashaun, Mumaw" and "Lower lip re-excision 1:30 to 3:00, stitch at 1:30". It consists of an oriented, 2.2 x 0.3 cm linear portion of mucosa excised to a depth of 0.5 cm.  The stitch indicates the 1:30 aspect.  The 1:30 aspect is inked blue.  The specimen is submitted entirely en face for frozen section analysis.  The frozen section remnant is submitted in cassette D1.          Imaging:   Recent Results (from the past 720 hour(s))   * XR CHEST PA AND LATERAL     Status: None    Narrative    Atsushi G Alcorta    RADIOLOGIST: Lucien Mons    XR CHEST PA AND LATERAL performed on 09/22/2022 4:30 PM    CLINICAL HISTORY: R05.9: Cough, unspecified type.  cough    TECHNIQUE: Frontal and lateral views of the chest.    COMPARISON:  Chest radiograph dated 10/20/2016    FINDINGS:    The heart size is normal.  The mediastinal contour is unremarkable.  The lungs are clear.   The bones are unremarkable.        Impression    NEGATIVE CHEST      Radiologist  location ID: [id number]          Special Procedures:       Assessment:   Travis Brooks is a 67 y.o. 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    Patient has some oral incompetence with thin liquids likely due to lower lip numbness. Took antibiotics post-op but stopped early due to diarrhea. Diarrhea and  C. Diff resolved on repeat testing. Denies any pain, drainage, or bleeding. Sutures removed and healing well with some crusting. Pathology pT1 margins negative and without high risk features. Doing well overall.      ICD-10-CM    1. SCC (squamous cell carcinoma), lip  C44.02            Plan:  No orders of the defined types were placed in this encounter.    1. Bacitracin or Vaseline 2-3 time per day to lower lip until crusting resolved  2. Advised on lower lip sun-screen/sun protection   3. Follow up in 2 months.     Phineas Douglas, MD 10/11/2022 09:35       Charmaine Downs, MD    CC:    PCP Jacalyn Lefevre, MD  [address]  [place] [address]   Referring Provider Charmaine Downs, MD  [address]  [address]  [place],  [place] [address]     Late entry for 10/11/22. I saw and examined the patient.  I reviewed the resident's note.  I agree with the findings and plan of care as documented in the resident's note.  Any exceptions/additions are edited/noted.    Charmaine Downs, MD

## 2022-10-18 ENCOUNTER — Encounter (INDEPENDENT_AMBULATORY_CARE_PROVIDER_SITE_OTHER): Payer: Self-pay | Admitting: Ophthalmology

## 2022-10-18 ENCOUNTER — Ambulatory Visit: Payer: Medicare PPO | Attending: Ophthalmology | Admitting: Ophthalmology

## 2022-10-18 ENCOUNTER — Other Ambulatory Visit: Payer: Self-pay

## 2022-10-18 ENCOUNTER — Inpatient Hospital Stay (HOSPITAL_COMMUNITY)
Admission: RE | Admit: 2022-10-18 | Discharge: 2022-10-18 | Disposition: A | Discharge status: Home / Self Care | Payer: Medicare PPO | Source: Ambulatory Visit

## 2022-10-18 DIAGNOSIS — C441122 Basal cell carcinoma of skin of right lower eyelid, including canthus: Secondary | ICD-10-CM | POA: Insufficient documentation

## 2022-10-18 DIAGNOSIS — Z9889 Other specified postprocedural states: Secondary | ICD-10-CM

## 2022-10-18 NOTE — Progress Notes (Addendum)
[facility]  [address]  [place] [address]  Operated by [facility]         Patient Name: Travis Brooks  MRN#: [id number]  Birthdate: [birth year]    Date of Service: 10/18/2022    Chief Complaint    Post-op Eye         Travis Brooks is a 67 y.o. male who presents today for evaluation/consultation of:  HPI       Post-op Eye    In right eye.  Pain was noted as 0/10.  Treatments tried include antibiotic ointment.  Treatment compliance is always. Additional comments: S/p RLL repair after mohs 10/11/22             Comments    1wk POV  Pt notes that VA is stable  Denies pain  Notes some discharge OD  Using ung as directed            Last edited by Diona Foley, COT on 10/18/2022  8:29 AM.        ROS    Positive for: Eyes (S/p RLL repair after mohs 10/11/22)  Negative for: Constitutional, Gastrointestinal, Neurological, Skin, Genitourinary, Musculoskeletal, HENT, Endocrine, Cardiovascular, Respiratory, Psychiatric, Allergic/Imm, Heme/Lymph  Last edited by Diona Foley, COT on 10/18/2022  8:29 AM.         All other systems Negative    Diona Foley, COT  10/18/2022, 08:30    Ophthalmologist/Optometrist:     Past Ocular history:   Past Surgical History:   Procedure Laterality Date    COLON SURGERY      COLONOSCOPY      COLONOSCOPY N/A 07/22/2022    Performed by Fidela Juneau, MD at [facility] OR T&D    EXCISION LESION LIP SQUAMOUS CELL CARCINOMA N/A 09/15/2022    Performed by Charmaine Downs, MD at [facility] OR 5 NORTH    HX BACK SURGERY  2022    HX EYELID SURGERY Right 10/11/2022    RLL reconstruction after mohs    HX HERNIA REPAIR  2016    x2    HX HIP REPLACEMENT Right 07/2020    LATERAL CANTHOPLASTY Right 09/01/2022    PLACEMENT FLAP LOCAL N/A 09/15/2022    Performed by Charmaine Downs, MD at [facility] OR 5 NORTH    RECONSTRUCTION EYELID Right 09/01/2022    Performed by Stark Jock, MD at [facility] OR 2 WEST    RIGHT TOTAL HIP ARTHROPLASTY USING NONCEMENTED ACCOLADE II IMPLANTS Right  08/13/2021    Performed by Alexia Freestone, MD at [facility] OR MAIN    TARRSORRAPHY Right 09/01/2022    Performed by Stark Jock, MD at [facility] OR 2 WEST         [name], MD 10/18/2022, 15:54    MD Addition to HPI:    The patient is a 67 y.o. male here with complaint of:    POW1 s/p stage 2 hughes OD   Feeling well, using ointment TID    Endorses stable VA and good healing. Denies discharge and pain. He has been using the ung as directed.     Assessment     Impression(s):      ICD-10-CM    1. Post-operative state  Z98.890 OPH EXTERNAL PHOTOS      2. 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 Regino Schultze    The patient was educated on the importance of avoiding excessive sun exposure and wearing sunscreen daily. Advised patient to re-apply sunscreen every 2-3 hours. Advised the patient to avoid going to the tanning beds. Advised to check periocular skin routinely for any changes, especially any new moles or changes in existing moles.    NSC OU  - not visually significant  - monitor     Imaging (CT/MRI) Reviewed:  I have seen and reviewed Radiology Images       Results Reviewed:         I saw and examined the patient.  I reviewed the technician/resident's note.  I agree with the findings and plan of care as documented in the resident's note.  Any exceptions/additions are edited/noted.  Stark Jock, MD 10/18/2022  15:54    I am scribing for, and in the  presence of, Dr. Cyndie Chime for services provided on 10/18/2022.  Leona Carry, SCRIBE   Leona Carry, SCRIBE  10/18/2022, 08:52    I personally performed the services described in this documentation, as scribed  in my presence, and it is both accurate  and complete.  Stark Jock, MD 10/18/2022  15:54      Base Eye Exam       Visual Acuity (Snellen - Linear)         Right Left    Dist cc 20/20 20/20      Correction: Glasses              Tonometry    Deferred 1wk pov             Pupils         Pupils APD    Right PERRL None    Left PERRL None              Visual Fields         Right Left     Full Full              Extraocular Movement         Right Left     Full Full              Neuro/Psych       Oriented x3: Yes    Mood/Affect: Normal                  Slit Lamp and Fundus Exam       External Exam         Right Left    External healing well, Periorbital edema Normal    Lagophthalmos 0 mm 0 mm              Slit Lamp Exam         Right Left    Lids/Lashes Lower lid healing well, no lag, chromic sutures in place Dermatochalasis - upper lid    Conjunctiva/Sclera White and quiet White and quiet  Cornea Clear Clear    Anterior Chamber Deep and quiet Deep and quiet    Iris Round and reactive Round and reactive    Lens Trace Nuclear sclerosis Trace Nuclear sclerosis                  Refraction       Wearing Rx         Sphere Cylinder Axis Add    Right -1.00 +1.00 014 +2.50    Left -0.25 +0.75 177 +2.50      Type: PAL                        I have seen and examined the above patient. I discussed the above diagnoses listed in the assessment and the above ophthalmic plan of care with the patient and patient's family. All questions were answered. I reviewed and, when necessary, made changes to the technician/resident note, documented ophthalmology exam, chief complaint, history of present illness, allergies, review of systems, past medical, past surgical, family and social history.    Stark Jock, MD 10/18/2022  15:54

## 2022-10-25 ENCOUNTER — Encounter (INDEPENDENT_AMBULATORY_CARE_PROVIDER_SITE_OTHER): Payer: Self-pay | Admitting: Ophthalmology

## 2022-10-28 ENCOUNTER — Encounter (INDEPENDENT_AMBULATORY_CARE_PROVIDER_SITE_OTHER): Payer: Self-pay | Admitting: Ophthalmology

## 2022-11-19 ENCOUNTER — Other Ambulatory Visit (HOSPITAL_BASED_OUTPATIENT_CLINIC_OR_DEPARTMENT_OTHER): Payer: Self-pay | Admitting: MOHS-Micrographic Surgery

## 2022-11-19 ENCOUNTER — Other Ambulatory Visit (INDEPENDENT_AMBULATORY_CARE_PROVIDER_SITE_OTHER): Payer: Self-pay | Admitting: Surgery

## 2022-11-19 ENCOUNTER — Other Ambulatory Visit (INDEPENDENT_AMBULATORY_CARE_PROVIDER_SITE_OTHER): Payer: Self-pay | Admitting: Student in an Organized Health Care Education/Training Program

## 2022-11-19 DIAGNOSIS — Z792 Long term (current) use of antibiotics: Secondary | ICD-10-CM

## 2022-11-21 NOTE — Telephone Encounter (Signed)
Encounter created with medication pended and routed to provider to review/approve/refuse.  Audry Pili, RN  11/21/2022, 07:42

## 2022-12-16 ENCOUNTER — Ambulatory Visit (INDEPENDENT_AMBULATORY_CARE_PROVIDER_SITE_OTHER): Payer: Medicare PPO | Admitting: Otolaryngology

## 2022-12-16 ENCOUNTER — Ambulatory Visit (INDEPENDENT_AMBULATORY_CARE_PROVIDER_SITE_OTHER): Payer: Medicare PPO

## 2022-12-23 ENCOUNTER — Other Ambulatory Visit: Payer: Self-pay

## 2022-12-23 ENCOUNTER — Ambulatory Visit (INDEPENDENT_AMBULATORY_CARE_PROVIDER_SITE_OTHER): Payer: Medicare PPO

## 2022-12-23 ENCOUNTER — Ambulatory Visit: Payer: Medicare PPO | Attending: Otolaryngology | Admitting: Otolaryngology

## 2022-12-23 VITALS — BP 148/80 | HR 74 | Temp 97.5°F | Ht 68.0 in | Wt 173.9 lb

## 2022-12-23 DIAGNOSIS — Z9889 Other specified postprocedural states: Secondary | ICD-10-CM | POA: Insufficient documentation

## 2022-12-23 DIAGNOSIS — Z4889 Encounter for other specified surgical aftercare: Secondary | ICD-10-CM | POA: Insufficient documentation

## 2022-12-23 DIAGNOSIS — C4402 Squamous cell carcinoma of skin of lip: Secondary | ICD-10-CM | POA: Insufficient documentation

## 2022-12-23 NOTE — Progress Notes (Signed)
[facility]  [address]  [place] [address]  Operated by [facility]    Oculoplastics & Orbital Surgery Note    12/23/2022    Patient Name: Travis Brooks    Date of Birth:  [birth year]    Referring Provider:  Self, Referral  No address on file    Ophthalmologist/Optometrist:     Past Ocular history:   Past Surgical History:   Procedure Laterality Date    COLON SURGERY      COLONOSCOPY      COLONOSCOPY N/A 07/22/2022    Performed by Fidela Juneau, MD at [facility] OR T&D    EXCISION LESION LIP SQUAMOUS CELL CARCINOMA N/A 09/15/2022    Performed by Charmaine Downs, MD at [facility] OR 5 NORTH    HX BACK SURGERY  2022    HX EYELID SURGERY Right 10/11/2022    RLL reconstruction after mohs    HX HERNIA REPAIR  2016    x2    HX HIP REPLACEMENT Right 07/2020    LATERAL CANTHOPLASTY Right 09/01/2022    PLACEMENT FLAP LOCAL N/A 09/15/2022    Performed by Charmaine Downs, MD at [facility] OR 5 NORTH    RECONSTRUCTION EYELID Right 09/01/2022    Performed by Stark Jock, MD at [facility] OR 2 WEST    RIGHT TOTAL HIP ARTHROPLASTY USING NONCEMENTED ACCOLADE II IMPLANTS Right 08/13/2021    Performed by Alexia Freestone, MD at [facility] OR MAIN    TARRSORRAPHY Right 09/01/2022    Performed by Stark Jock, MD at [facility] OR 2 WEST         [name], MD 12/23/2022, 13:08    MD Addition to HPI:    The patient is a 67 y.o. male here with complaint of:  POM3 s/p stage 2 hughes OD. Feeling well. Eyes do not feel dry. Good VA. No complaints, happy with outcome    Assessment     Impression(s):      ICD-10-CM    1. 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   - healing well today, cornea clear, no lag  - Ats QID and PRN  - advise patient find and follow regularly with optometrist  - cont regular follow-ups with dermatology   - RTC 6 mos      Results Reviewed:         Base  Eye Exam       Visual Acuity (Snellen - Linear)         Right Left    Dist cc 20/20 20/20              Tonometry (1:06 PM)         Right Left    Pressure 17 18              Pupils         Pupils    Right PERRL    Left PERRL              Visual Fields         Right Left     Full Full              Extraocular Movement  Right Left     Full Full              Neuro/Psych       Oriented x3: Yes    Mood/Affect: Normal                  Slit Lamp and Fundus Exam       External Exam         Right Left    External Normal Normal    Lagophthalmos 0 mm 0 mm              Slit Lamp Exam         Right Left    Lids/Lashes Lower lid healing well, no lag Dermatochalasis - upper lid    Conjunctiva/Sclera White and quiet White and quiet    Cornea Clear Clear    Anterior Chamber Deep and quiet Deep and quiet    Iris Round and reactive Round and reactive    Lens Trace Nuclear sclerosis Trace Nuclear sclerosis                  Late entry for 12/23/22. I saw and examined the patient.  I reviewed the resident's note.  I agree with the findings and plan of care as documented in the resident's note.  Any exceptions/additions are edited/noted.    Stark Jock, MD

## 2022-12-23 NOTE — Progress Notes (Signed)
[facility]  DEPARTMENT OF OTOLARYNGOLOGY - HEAD AND NECK SURGERY  CLINIC PROGRESS NOTE    Name: Travis Brooks, 67 y.o. male  MRN: [id number]  Date of Birth: 09-06-1955  Date of Service: 12/23/2022    Subjective: Travis Brooks is a 67 y.o. 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    Patient presents for follow up today. Has been overall doing well in the interim. Has had intermittent oral incompetence in the interim however this is improved since last visit. States that his lower lip sensation is improving.     Past Medical History:  Past Medical History:   Diagnosis Date    Allergic rhinitis 20years ago    Cancer (CMS HCC)     SCCA    CPAP (continuous positive airway pressure) dependence     Diverticulitis     Esophageal reflux     controlled with medication    H/O hearing loss     bilat hearing aids    Hearing loss 9 years ago    Hyperlipidemia     controlled with medication    Hypertension     controlled with medication    Osteoarthritis     Sleep apnea     Wears glasses          Past Surgical History:  Past Surgical History:   Procedure Laterality Date    Colon surgery      Colonoscopy      Hx back surgery  2022    Hx eyelid surgery Right 10/11/2022    Hx hernia repair  2016    Hx hip replacement Right 07/2020    Lateral canthoplasty Right 09/01/2022     Medications:  Outpatient Medications Marked as Taking for the 12/23/22 encounter (Office Visit) with Charmaine Downs, MD   Medication Sig    aspirin (ECOTRIN) 81 mg Oral Tablet, Delayed Release (E.C.) Take 1 Tablet (81 mg total) by mouth Once a day Takes in am    erythromycin (ROMYCIN) 5 mg/gram (0.5 %) Ophthalmic Ointment Apply to right eye Four times a day Apply to right eye at lid crease 4x/day    HYDROcodone-acetaminophen (LORTAB) 7.5-325 mg per 15 mL oral liquid Take 15 mL (7.5 mg  total) by mouth Every 6 hours as needed for up to 12 doses    lansoprazole (PREVACID ORAL) Take 1 Tablet by mouth Once per day as needed    losartan (COZAAR) 50 mg Oral Tablet Take 1 Tablet (50 mg total) by mouth Once a day Takes in evening    magnesium oxide (MAG-OX) 400 mg Oral Tablet Take 1 Tablet (400 mg total) by mouth Every evening    metoprolol succinate (TOPROL-XL) 50 mg Oral Tablet Sustained Release 24 hr Take 1 Tablet (50 mg total) by mouth Once a day Takes in am    MV with Min-Lycopene-Lutein (CENTRUM SILVER) 0.4 mg-300 mcg- 250 mcg Oral Tablet Take 1 Tablet by mouth Once a day    potassium chloride (K-DUR) 20 mEq Oral Tab Sust.Rel. Particle/Crystal Take 1 Tablet (20 mEq total) by mouth Once a day Takes in evening    rosuvastatin (CRESTOR) 10 mg Oral Tablet Take 1 Tablet (10 mg total) by mouth Once a day Takes in evening      Family History:  Family Medical History:  Problem Relation (Age of Onset)    Hypertension (High Blood Pressure) Mother            Social History:  Social History     Occupational History    Not on file   Tobacco Use    Smoking status: Never    Smokeless tobacco: Never   Vaping Use    Vaping status: Never Used   Substance and Sexual Activity    Alcohol use: Never    Drug use: Never    Sexual activity: Not on file     Allergies:  No Known Allergies    Review of Systems:                                                          All other systems reviewed and found to be negative.    Physical Exam:  BP (Non-Invasive): (!) 148/80 (pt notified) Temperature: 36.4 C (97.5 F) Heart Rate: 74      Height: 172.7 cm (5\' 8" ) Weight: 78.9 kg (173 lb 15.1 oz) Body mass index is 26.45 kg/m.    General Appearance: Well-appearing 67 y.o. male who is pleasant, cooperative and in no acute distress.  Head: Normocephalic.  Skin: Warm and dry.  Oral Cavity/Oropharynx: Lower lip healing well. [place] boarder with good shape. On palpation lower lip is firm which is most prominent along central  aspect of lower lip.   Cardiovascular: Upper extremities are warm and well perfused, with no cyanosis of the hands or fingers.  Respiratory: No apparent stridorous breathing. No acute respiratory distress.  Musculoskeletal: Moving all extremities.  Neurologic: Grossly normal. Cranial nerves II-XII grossly intact bilaterally.  Psychiatric:  Alert and with appropriate affect.    Review of Information:    Pathology:   No results found for this or any previous visit (from the past 720 hour(s)).     Imaging:           Special Procedures:       Assessment:   Travis Brooks is a 67 y.o. 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 Pathology pT1 margins negative and without high risk features. Doing well overall.       ICD-10-CM    1. SCC (squamous cell carcinoma), lip  C44.02           Plan:  No orders of the defined types were placed in this encounter.    1. Recommend scar massage for lower lip  2. Advised on lower lip sun-screen/sun protection   3. New postoperative photos obtained this visit  4. Follow up in 3 months    Natasha Mead, MD    Charmaine Downs, MD    CC:    PCP Travis Lefevre, MD  [address]  [place] [address]   Referring Provider Self, Referral  No address on file       Late entry for 12/23/22. I saw and examined the patient.  I reviewed the resident's note.  I agree with the findings and plan of care as documented in the resident's note.  Any exceptions/additions are edited/noted.    Charmaine Downs, MD

## 2023-03-05 ENCOUNTER — Other Ambulatory Visit: Payer: Self-pay

## 2023-03-05 ENCOUNTER — Encounter (HOSPITAL_BASED_OUTPATIENT_CLINIC_OR_DEPARTMENT_OTHER): Payer: Self-pay

## 2023-03-05 ENCOUNTER — Emergency Department
Admission: EM | Admit: 2023-03-05 | Discharge: 2023-03-05 | Disposition: A | Discharge status: Home / Self Care | Payer: Medicare PPO | Attending: Emergency Medicine | Admitting: Emergency Medicine

## 2023-03-05 DIAGNOSIS — J069 Acute upper respiratory infection, unspecified: Secondary | ICD-10-CM

## 2023-03-05 DIAGNOSIS — Z1152 Encounter for screening for COVID-19: Secondary | ICD-10-CM | POA: Insufficient documentation

## 2023-03-05 DIAGNOSIS — J40 Bronchitis, not specified as acute or chronic: Secondary | ICD-10-CM | POA: Insufficient documentation

## 2023-03-05 DIAGNOSIS — J029 Acute pharyngitis, unspecified: Secondary | ICD-10-CM | POA: Insufficient documentation

## 2023-03-05 LAB — RAPID THROAT SCREEN, STREPTOCOCCUS, WITH REFLEX: THROAT RAPID SCREEN, STREPTOCOCCUS: NEGATIVE

## 2023-03-05 LAB — COVID-19, FLU A/B, RSV RAPID BY PCR
INFLUENZA VIRUS TYPE A: NOT DETECTED
INFLUENZA VIRUS TYPE B: NOT DETECTED
RESPIRATORY SYNCTIAL VIRUS (RSV): NOT DETECTED
SARS-CoV-2: NOT DETECTED

## 2023-03-05 MED ORDER — AZITHROMYCIN 500 MG TABLET
500.0000 mg | ORAL_TABLET | ORAL | 0 refills | Status: DC
Start: 2023-03-05 — End: 2023-12-19

## 2023-03-05 MED ORDER — AZITHROMYCIN 250 MG TABLET
ORAL_TABLET | ORAL | Status: AC
Start: 2023-03-05 — End: 2023-03-05
  Filled 2023-03-05: qty 2

## 2023-03-05 MED ORDER — AZITHROMYCIN 250 MG TABLET
500.0000 mg | ORAL_TABLET | ORAL | Status: AC
Start: 2023-03-05 — End: 2023-03-05
  Administered 2023-03-05: 500 mg via ORAL

## 2023-03-05 NOTE — ED Nurses Note (Signed)
Pt c/o sore throat, nasal congestion and drainage with dry cough since last week. Denies fever, NVD.

## 2023-03-05 NOTE — ED Triage Notes (Signed)
Patient states on Wednesday symptoms started. Cough, congestion, fever, scratchy throat, headache. Symptoms have not gotten worse but have not improved.

## 2023-03-05 NOTE — ED Provider Notes (Addendum)
[facility], [facility] - Emergency Department  ED Primary Provider Note  History of Present Illness   Chief Complaint   Patient presents with    Flu Like Symptoms     Travis Brooks is a 67 y.o. male who had concerns including Flu Like Symptoms.  Arrival: The patient arrived by Car complaining of coughing nonproductive for the past 3 days.  He has had nasal congestion as well.  He is complaining of a sore throat denies any earache or severe headache.  Denies any fever chills.  No aches and pains all over.  No chest pain or shortness breath.  No difficulty swallowing or change in voice.  Denies any abdominal pain.    HPI  Review of Systems   Review of Systems   Constitutional:  Positive for activity change and appetite change. Negative for chills and fever.   HENT:  Positive for congestion. Negative for ear pain and sore throat.    Eyes:  Negative for pain and visual disturbance.   Respiratory:  Positive for cough. Negative for shortness of breath.    Cardiovascular:  Negative for chest pain and palpitations.   Gastrointestinal:  Negative for abdominal pain and vomiting.   Genitourinary:  Negative for dysuria and hematuria.   Musculoskeletal:  Negative for arthralgias and back pain.   Skin:  Negative for color change and rash.   Neurological:  Negative for seizures and syncope.   All other systems reviewed and are negative.     Historical Data   History Reviewed This Encounter:     Physical Exam   ED Triage Vitals [03/05/23 1018]   BP (Non-Invasive) (!) 149/92   Heart Rate 81   Respiratory Rate 18   Temperature 36.9 C (98.4 F)   SpO2 99 %   Weight 78.5 kg (173 lb)   Height 1.727 m (5\' 8" )     Physical Exam  Vitals and nursing note reviewed.   Constitutional:       General: He is not in acute distress.     Appearance: Normal appearance. He is well-developed and normal weight.   HENT:      Head: Normocephalic and atraumatic.      Right Ear: External ear normal.      Left Ear: External ear  normal.      Nose: Nose normal.      Mouth/Throat:      Mouth: Mucous membranes are dry.   Eyes:      Extraocular Movements: Extraocular movements intact.      Conjunctiva/sclera: Conjunctivae normal.      Pupils: Pupils are equal, round, and reactive to light.   Cardiovascular:      Rate and Rhythm: Normal rate and regular rhythm.      Pulses: Normal pulses.      Heart sounds: Normal heart sounds. No murmur heard.  Pulmonary:      Effort: Pulmonary effort is normal. No respiratory distress.      Breath sounds: Normal breath sounds.   Abdominal:      General: Bowel sounds are normal.      Palpations: Abdomen is soft.      Tenderness: There is no abdominal tenderness.   Musculoskeletal:         General: No swelling. Normal range of motion.      Cervical back: Normal range of motion and neck supple.   Skin:     General: Skin is warm and dry.  Capillary Refill: Capillary refill takes less than 2 seconds.   Neurological:      General: No focal deficit present.      Mental Status: He is alert and oriented to person, place, and time.   Psychiatric:         Mood and Affect: Mood normal.         Behavior: Behavior normal.         Thought Content: Thought content normal.       Patient Data     Labs Ordered/Reviewed   RAPID THROAT SCREEN, STREPTOCOCCUS, WITH REFLEX - Normal    Narrative:     Read-Now Mode   COVID-19, FLU A/B, RSV RAPID BY PCR - Normal    Narrative:     Results are for the simultaneous qualitative identification of SARS-CoV-2 (formerly 2019-nCoV), Influenza A, Influenza B, and RSV RNA. These etiologic agents are generally detectable in nasopharyngeal and nasal swabs during the ACUTE PHASE of infection. Hence, this test is intended to be performed on respiratory specimens collected from individuals with signs and symptoms of upper respiratory tract infection who meet Centers for Disease Control and Prevention (CDC) clinical and/or epidemiological criteria for Coronavirus Disease 2019 (COVID-19) testing. CDC  COVID-19 criteria for testing on human specimens is available at [facility] webpage information for Healthcare Professionals: Coronavirus Disease 2019 (COVID-19) ([employer]).     False-negative results may occur if the virus has genomic mutations, insertions, deletions, or rearrangements or if performed very early in the course of illness. Otherwise, negative results indicate virus specific RNA targets are not detected, however negative results do not preclude SARS-CoV-2 infection/COVID-19, Influenza, or Respiratory syncytial virus infection. Results should not be used as the sole basis for patient management decisions. Negative results must be combined with clinical observations, patient history, and epidemiological information. If upper respiratory tract infection is still suspected based on exposure history together with other clinical findings, re-testing should be considered.    Test methodology:   Cepheid Xpert Xpress SARS-CoV-2/Flu/RSV Assay real-time polymerase chain reaction (RT-PCR) test on the GeneXpert Dx and Xpert Xpress systems.   THROAT CULTURE, BETA HEMOLYTIC STREPTOCOCCUS     No orders to display     Medical Decision Making        Medical Decision Making  Patient is 67 year old white male complaining of coughing nonproductive over the past 3 days.  Patient also has nasal congestion.  No fever chills.  No myalgias or arthralgias.  Patient denies any chest pain or shortness breath.  Patient denies any fever chills.  Patient is complaining of sore throat.  Patient denies any earache or severe headache.  Patient will have a strep titer as well as for Plex and be treated for results.  Will then follow up with his PMD in the next 3 days.    Amount and/or Complexity of Data Reviewed  Labs: ordered.    Risk  Prescription drug management.             Medications Administered in the ED   azithromycin [facility]) tablet (has no administration in time range)      Clinical Impression   Upper respiratory tract infection, unspecified type (Primary)   Bronchitis   Pharyngitis, unspecified etiology       Disposition: Discharged               Clinical Impression   Upper respiratory tract infection, unspecified type (Primary)   Bronchitis   Pharyngitis, unspecified etiology       Current  Discharge Medication List        START taking these medications    Details   azithromycin (ZITHROMAX) 500 mg Oral Tablet Take 1 Tablet (500 mg total) by mouth Every 24 hours  Qty: 6 Tablet, Refills: 0

## 2023-03-05 NOTE — ED Nurses Note (Signed)
Patient discharged home with family. Reviewed instructions and prescriptions with patient. Questions sufficiently answered as needed. Patient verbalized understanding of instructions and prescriptions. Patient encouraged to follow up with PCP as indicated. In the event of an emergency, patient instructed to call 911 or go to the nearest emergency room. Patient ambulated off the unit.

## 2023-03-08 LAB — THROAT CULTURE, BETA HEMOLYTIC STREPTOCOCCUS: THROAT CULTURE: NORMAL

## 2023-03-31 ENCOUNTER — Other Ambulatory Visit: Payer: Self-pay

## 2023-03-31 ENCOUNTER — Encounter (INDEPENDENT_AMBULATORY_CARE_PROVIDER_SITE_OTHER): Payer: Self-pay | Admitting: Otolaryngology

## 2023-03-31 ENCOUNTER — Ambulatory Visit: Payer: Medicare PPO | Attending: Otolaryngology | Admitting: Otolaryngology

## 2023-03-31 VITALS — BP 161/84 | HR 67 | Temp 97.5°F | Ht 68.0 in | Wt 176.8 lb

## 2023-03-31 DIAGNOSIS — C4402 Squamous cell carcinoma of skin of lip: Secondary | ICD-10-CM | POA: Insufficient documentation

## 2023-03-31 NOTE — Progress Notes (Signed)
PATIENT NAME:  Travis Brooks  MRN:  [id number]  DOB:  [birth year]  DATE OF SERVICE: 03/31/2023    HPI:  Travis Brooks is a 67 y.o. year old male  who is nearly 6 months postop excision of lower lip squamous cell carcinoma/actinic cheilitis with lip advancement (roll) on 09/15/2022.  Continues to make slow improvements.  He had some tightness of the scar on his last visit and he feels this is gotten better with massage.  He still notes occasional oral incompetence for liquids and asked to be careful when drinking.    Physical Exam:  Blood pressure (!) 161/84, pulse 67, temperature 36.4 C (97.5 F), height 1.727 m (5\' 8" ), weight 80.2 kg (176 lb 12.9 oz), SpO2 98%.  Body mass index is 26.88 kg/m.  General Appearance: Pleasant, cooperative, healthy, and in no acute distress.  Eyes: Conjunctivae/corneas clear, PERRLA, EOM's intact.  Surgical changes/slight ectropion of the right lower eyelid.  No epiphora.  Head and Face: Normocephalic, atraumatic.  Face symmetric, no obvious lesions.   Pinnae: Normal shape and position.   Nose:  External pyramid midline.     Oral Cavity :  Lower lip incision is has healed overall pretty well.  He has some scalloping of the mid portion of the lip with smiling.  It is still a bit tight/firm palpation.  No mucosal lesions, masses, or evidence of any recurrent disease    Oropharynx:  Clear  Hypopharynx/Larynx:  voice normal.  Neck:  No palpable thyroid, salivary gland, or neck masses.  Heme/Lymph:  No cervical adenopathy.  Cardiovascular:  Good perfusion of upper extremities.  No cyanosis of the hands or fingers.  Lungs: No apparent stridorous breathing. No acute distress.  Skin: Skin warm and dry.  Neurologic: Cranial nerves:  grossly intact.  Psychiatric:  Alert and oriented x 3.      Assessment:  Assessment/Plan   1. SCC (squamous cell carcinoma), lip    Doing well, nearly 6 months postop.    Plan:  -Continue massage  -Recheck in 3-4 months.      Charmaine Downs, MD 03/31/2023,  17:38    PCP:  Jacalyn Lefevre, MD  [address]  [place] [address]   REF:  No referring provider defined for this encounter.

## 2023-07-17 ENCOUNTER — Ambulatory Visit (INDEPENDENT_AMBULATORY_CARE_PROVIDER_SITE_OTHER): Payer: Self-pay | Admitting: Ophthalmology

## 2023-07-25 ENCOUNTER — Encounter (INDEPENDENT_AMBULATORY_CARE_PROVIDER_SITE_OTHER): Payer: Self-pay | Admitting: Otolaryngology

## 2023-07-25 ENCOUNTER — Other Ambulatory Visit: Payer: Self-pay

## 2023-07-25 ENCOUNTER — Encounter (INDEPENDENT_AMBULATORY_CARE_PROVIDER_SITE_OTHER): Payer: Self-pay | Admitting: Ophthalmology

## 2023-07-25 ENCOUNTER — Ambulatory Visit: Attending: Ophthalmology | Admitting: Ophthalmology

## 2023-07-25 ENCOUNTER — Ambulatory Visit (HOSPITAL_COMMUNITY)
Admission: RE | Admit: 2023-07-25 | Discharge: 2023-07-25 | Disposition: A | Discharge status: Home / Self Care | Source: Ambulatory Visit

## 2023-07-25 ENCOUNTER — Ambulatory Visit (INDEPENDENT_AMBULATORY_CARE_PROVIDER_SITE_OTHER): Payer: Self-pay | Admitting: Otolaryngology

## 2023-07-25 VITALS — BP 150/87 | HR 65 | Temp 98.8°F | Ht 67.0 in | Wt 176.4 lb

## 2023-07-25 DIAGNOSIS — C441122 Basal cell carcinoma of skin of right lower eyelid, including canthus: Secondary | ICD-10-CM | POA: Insufficient documentation

## 2023-07-25 DIAGNOSIS — Z8669 Personal history of other diseases of the nervous system and sense organs: Secondary | ICD-10-CM

## 2023-07-25 DIAGNOSIS — C001 Malignant neoplasm of external lower lip: Secondary | ICD-10-CM

## 2023-07-25 DIAGNOSIS — Z08 Encounter for follow-up examination after completed treatment for malignant neoplasm: Secondary | ICD-10-CM

## 2023-07-25 DIAGNOSIS — H2513 Age-related nuclear cataract, bilateral: Secondary | ICD-10-CM | POA: Insufficient documentation

## 2023-07-25 DIAGNOSIS — Z85819 Personal history of malignant neoplasm of unspecified site of lip, oral cavity, and pharynx: Secondary | ICD-10-CM | POA: Insufficient documentation

## 2023-07-25 DIAGNOSIS — Z9889 Other specified postprocedural states: Secondary | ICD-10-CM | POA: Insufficient documentation

## 2023-07-25 DIAGNOSIS — Z85818 Personal history of malignant neoplasm of other sites of lip, oral cavity, and pharynx: Secondary | ICD-10-CM

## 2023-07-25 DIAGNOSIS — H02102 Unspecified ectropion of right lower eyelid: Secondary | ICD-10-CM | POA: Insufficient documentation

## 2023-07-25 NOTE — Progress Notes (Signed)
 PATIENT NAME:  Travis Brooks  MRN:  [id number]  DOB:  Aug 20, 1955  DATE OF SERVICE: 07/25/2023    HPI:  Travis Brooks is a 68 y.o. year old male  who is nearly 10 months postop excision of lower lip squamous cell carcinoma/actinic cheilitis with lip advancement (roll) on 09/15/2022.  Continues to make improvements.  He continues to have some tightness of the scar but continues to massage. He notes occasional oral incompetence for liquids is improving.    Physical Exam:  Blood pressure (!) 150/87, pulse 65, temperature 37.1 C (98.8 F), temperature source Temporal, height 1.702 m (5\' 7" ), weight 80 kg (176 lb 5.9 oz).  Body mass index is 27.62 kg/m.  General Appearance: Pleasant, cooperative, healthy, and in no acute distress.  Eyes: Conjunctivae/corneas clear, PERRLA, EOM's intact.  Surgical changes/slight ectropion of the right lower eyelid.  No epiphora.  Head and Face: Normocephalic, atraumatic.  Face symmetric, no obvious lesions.   Pinnae: Normal shape and position.   Nose:  External pyramid midline. Turbinates small, septum midline.    Oral Cavity :  Lower lip incision is has healed overall pretty well.  He has persistent scalloping of the mid portion of the lip with smiling.  It is still a bit tight/firm palpation.  No mucosal lesions, masses, or evidence of any recurrent disease    Hypopharynx/Larynx:  voice normal.  Neck:  No palpable thyroid, salivary gland, or neck masses.  Heme/Lymph:  No cervical adenopathy.  Lungs: No apparent stridorous breathing. No acute distress.  Skin: Skin warm and dry.  Neurologic: Cranial nerves:  grossly intact.  Psychiatric:  Alert and oriented x 3.      Assessment:  No diagnosis found.  Healing well, nearly 10 months postop with some persistent tightness and scalloping when smiling. Photos obtained today.    Plan:  -Continue massage. Asked patient to return promptly if any new lesions are noted and continue seeing dermatologist for follow up every 6 months  -Recheck in 1  year    Carie Caddy, MD      PCP:  Jacalyn Lefevre, MD  [address]  [place] [address]   REF:  Charmaine Downs, MD  [address]  [address]  [place],  [place] [address]

## 2023-07-25 NOTE — Progress Notes (Addendum)
 OPHTHALMOLOGY, [facility]  [address]  [place] [address]  Operated by [facility]         Patient Name: Travis Brooks  MRN#: [id number]  Birthdate: June 26, 1955    Date of Service: 07/25/2023    Chief Complaint    Follow Up         Travis Brooks is a 68 y.o. male who presents today for evaluation/consultation of:  HPI    Pt is here for 6 month follow up   States that [place] is stable OU , no new changes since last visit   Pt states he is having some itchiness in the lower eyelid of OD   Denies eye pain  Denies headaches   Denies diplopia   Denies watering/tearing OU   Denies colored discharge OU   Denies new/abnormal FOL/floaters  Denies curtains/veils  Denies missing VA/distorted VA  Pt states he is using systane OU PRN         Last edited by Travis Brooks, COA on 07/25/2023  9:19 AM.        ROS    Positive for: Eyes (follow up)  Negative for: Constitutional, Gastrointestinal, Neurological, Skin, Genitourinary, Musculoskeletal, HENT, Endocrine, Cardiovascular, Respiratory, Psychiatric, Allergic/Imm, Heme/Lymph  Last edited by Travis Brooks, COA on 07/25/2023  9:19 AM.         All other systems Negative  Base Eye Exam       Visual Acuity (Snellen - Linear)         Right Left    Dist cc 20/15 20/20      Correction: Glasses              Tonometry (icare, 9:26 AM)         Right Left    Pressure 16 18              Pupils         Pupils Shape React APD    Right PERRL Round Minimal None    Left PERRL Round Minimal None              Visual Fields         Right Left     Full Full              Extraocular Movement         Right Left     Full Full              Neuro/Psych       Oriented x3: Yes    Mood/Affect: Normal                  Slit Lamp and Fundus Exam       External Exam         Right Left    External s/p hughes flap healing very well Normal    Inferior scleral show 0 mm 0 mm    Lagophthalmos 0 mm 0 mm              Slit Lamp Exam         Right Left    Lids/Lashes Lower lid healing well, no  ectropion Dermatochalasis - upper lid    Conjunctiva/Sclera White and quiet White and quiet    Cornea rare PEE rare PEE    Anterior Chamber Deep and quiet Deep and quiet    Iris Round and reactive Round and reactive    Lens Trace Nuclear sclerosis  Trace Nuclear sclerosis                    PA-C Addition to HPI: 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 He states he has been doing well, has slight irritation OD lateral corner  of his eyes. He is using  artificial tears as needed. Happy with his outcome. He is following with dermatology.         ENCOUNTER DIAGNOSES     ICD-10-CM   1. Basal cell carcinoma (BCC) of skin of right lower eyelid including canthus  C44.1122   2. Post-operative state  Z98.890   3. Nuclear senile cataract of both eyes  H25.13     Orders Placed This Encounter   Procedures    OPH EXTERNAL PHOTOS       Ophthalmic Plan of Care:  H/O [facility] RLL/postsurgical state eye  -healing very well, two lashes RLL centrally slightly turned but not touching surface of eye  -Continue following with dermatology  -Artificial tears PRN  -No physical exam findings suggestive of recurrence.   -continue to recommend avoiding excessive sun exposure and wearing sunscreen daily. Recommend patient to re-apply sunscreen every 2-3 hours. recommend the patient to avoid going to the tanning beds. Recommend to check periocular skin routinely for any changes, especially any new moles or changes in existing moles.     NSC OU  NVS continue ot monitor    Follow up:    I have asked Travis Brooks to follow up  with his normal eye doctor and with dermatology.         Travis Brooks, [place] 07/25/2023 09:35     I independently of the faculty provider spent a total of (10) minutes in direct/indirect care of this patient including initial evaluation, review of laboratory, radiology, diagnostic studies, review of medical record, order entry and  coordination of care.    I saw and evaluated the patient with the PA-C.  I spent greater than 75% of this visit in direct patient care as described below.  I personally examined the patient and confirmed pertinent history.  I agree with the evaluation and plan as documented above.    Travis Jock, MD 08/07/2023 22:09

## 2023-08-07 ENCOUNTER — Encounter (INDEPENDENT_AMBULATORY_CARE_PROVIDER_SITE_OTHER): Payer: Self-pay | Admitting: Ophthalmology

## 2023-08-31 ENCOUNTER — Ambulatory Visit
Admission: RE | Admit: 2023-08-31 | Discharge: 2023-08-31 | Disposition: A | Discharge status: Home / Self Care | Source: Ambulatory Visit | Attending: Family Medicine | Admitting: Family Medicine

## 2023-08-31 ENCOUNTER — Other Ambulatory Visit: Payer: Self-pay

## 2023-08-31 ENCOUNTER — Other Ambulatory Visit (HOSPITAL_COMMUNITY): Payer: Self-pay | Admitting: Family Medicine

## 2023-08-31 DIAGNOSIS — R202 Paresthesia of skin: Secondary | ICD-10-CM

## 2023-10-17 ENCOUNTER — Other Ambulatory Visit: Payer: Self-pay

## 2023-10-23 ENCOUNTER — Other Ambulatory Visit (HOSPITAL_COMMUNITY): Payer: Self-pay | Admitting: Family Medicine

## 2023-10-23 DIAGNOSIS — M545 Low back pain, unspecified: Secondary | ICD-10-CM

## 2023-10-24 ENCOUNTER — Ambulatory Visit (HOSPITAL_COMMUNITY)
Admission: RE | Admit: 2023-10-24 | Discharge: 2023-10-24 | Disposition: A | Discharge status: Still In Hospital | Payer: Self-pay | Source: Ambulatory Visit

## 2023-10-24 ENCOUNTER — Other Ambulatory Visit: Payer: Self-pay

## 2023-10-24 ENCOUNTER — Encounter (HOSPITAL_COMMUNITY): Payer: Self-pay

## 2023-10-24 DIAGNOSIS — M5416 Radiculopathy, lumbar region: Secondary | ICD-10-CM | POA: Insufficient documentation

## 2023-10-24 DIAGNOSIS — M545 Low back pain, unspecified: Secondary | ICD-10-CM | POA: Insufficient documentation

## 2023-10-24 NOTE — PT Evaluation (Signed)
 [facility] Medicine [facility]  Outpatient Physical Therapy  [address]  [place], [address]  [phone]  (Fax) [phone]      Physical Therapy Lumbar Evaluation    Date: 10/24/2023  Patient's Name: Travis Brooks  Date of Birth: 1955/11/14  Physical Therapy Evaluation      Evaluating Physical Therapist: Floy Hutch PT, DPT   PT diagnosis/Reason for Referral: Low back pain   Next Scheduled Physician Appointment: September   Allergies/Contraindications: None stated.              SUBJECTIVE  Date of onset: 2022     Mechanism of injury: Symptoms began following back surgery    Current Presentation: Patient reports increased pain and radiation into BLE. Patient reports radicular symptoms extend down to feet bilaterally. Patient reports radicular symptoms are there majority of the time. Patient reports numbness in "middle toe" on both feet. Patient reports numbness is constant. Patient reports pain that radiates from hip down anterior thighs to knees, during the day patient denies lower leg symptoms but reports B foot pain. At night patient reports symptoms are worse and describes pressure/tightness in B lower legs in addition to above symptoms. Patient reports increased local LBP with prolonged sitting, sit to stand transition. Patient notes routine walking doesn't seem to bother symptoms. Higher level  physical activity triggers symptoms. Patient notes comfort when sitting supported.     PLOF: Patient reports history of low back pain/radicular symptoms with history of lumbar decompression surgery.     Previous episodes/treatments: Patient reports one injection post surgery which provided significant relief.     Past Medical History:   Past Medical History:   Diagnosis Date    Allergic rhinitis 20years ago    Cancer (CMS HCC)     SCCA    CPAP (continuous positive airway pressure) dependence     Diverticulitis     Esophageal reflux     controlled with medication    H/O hearing loss     bilat  hearing aids    Hearing loss 9 years ago    Hyperlipidemia     controlled with medication    Hypertension     controlled with medication    Osteoarthritis     Sleep apnea     Wears glasses          Past Surgical History:   Past Surgical History:   Procedure Laterality Date    COLON SURGERY      COLONOSCOPY      HX BACK SURGERY  2022    HX EYELID SURGERY Right 10/11/2022    RLL reconstruction after mohs    HX HERNIA REPAIR  2016    x2    HX HIP REPLACEMENT Right 07/2020    LATERAL CANTHOPLASTY Right 09/01/2022       Medications for this problem:  Patient denies    Diagnostic tests: XR lumbar spine 08/31/23   FINDINGS:  Levoconvex lumbar curvature with apex at L3-4  Loss the normal lordosis  Minimal anterior listhesis L1-2  Minimal retrolisthesis L2-3 and L3-4  Posterior decompression T12-L1 through L4-5     Moderate/severe asymmetric disc space narrowing most pronounced L2-3 and L3-4  Moderate facet DJD  SI joints are unremarkable           IMPRESSION:  Chronic advanced  lumbar degenerative disease  Postop changes       Patient goals: REDUCE PAIN and NORMALIZE FUNCTION    Occupation:  Retired, [employer].     Pain location: Patient reports bilateral low back pain, reports right worse than left with a sharp catch right, B anterior thighs from hips to knees and B feet.                    Pain description:  Aching in upper legs, burning left foot, dull pain right foot    Pain frequency:  CONTINUOUS    Pain rating: Now 1/10   Best 1/10   Worst 10/10    Radiculopathy: BLE     Pain increases with: ADLs, ACTIVITY, EXERCISE, SIT, and BENDING           decreases with : HEAT, COLD, and SIT    Sensation: Patient reports numbness in B middle toes     Weakness: Patient reports weakness in BLE     Sleep affected: Patient reports poor tolerance to sleeping. Patient notes he is primarily a right side sleeper in fetal position with pillow between knees.     Bowel/bladder problems:Patient denies     Subjective Functional  Reports:    Sitting: LIMITED    Standing: LIMITED    Walking: LIMITED    Lifting: LIMITED          OBJECTIVE    Patient-Specific Functional Score:    Problem Score   1. Lifting heavy objects  5   2. Repetitive bending  5   Total 5   Total score = sum of the activity scores/number of activities    Minimal detectable change (90% CI) for avg score = 2 points    Minimal detectable change (90% CI) for single activity score = 3 points         AROM   Right IE  Left IE    Flexion 70 NT   Extension 5 NT   Sidebend 23 32   Rotation NT NT   ROM comments: Patient notes difficulty returning to neutral from flexion, and notes right sided/central LBP with side bending B    Strength (Manual Muscle Testing per [employer] Muscle Grading system)     Right IE  Left IE    Hip flexion (L1,2) 4+/5 5/5   Hip extension (L5,S1,2) NT NT   Knee flexion (S1) 5/5 5/5   Knee extension (L2-4) 5/5 5/5   Ankle DF (L4-5) 5/5 5/5   Ankle PF (S1,2) 5/5 5/5   Strength comments:     Palpation: Lumbar paraspinal guarding    Joint mobility NT     Posture: DECREASED LORDOSIS, FORWARD HEAD, and SCOLIOSIS    Gait:  WFL    Reflexes    Patellar 1+ B   Achilles 1+ B      Special tests:   ( + ) Growlers sign  ( - ) SLUMP Test BLE  ( - ) SLR BLE   ( - ) Sign of the buttock BLE   ( - ) Piriformis BLE   ( - ) FABER/PATRICK's Test BLE   ( - ) Active SLR BLE    Patient demos good tolerance to static prone positioning, patient demos fair tolerance to repeated extension.       Treatment provided:REVIEW OF POC AND GOALS WITH PATIENT, ALL QUESTIONS ANSWERED, PATIENT EDUCATION, and THERAPEUTIC EXERCISE     HEP Access Code: [email or web address]  URL: [email or web address]  Date: 10/24/2023  Prepared by: Floy Hutch    Exercises  - Prone on Elbows Stretch  - 1-3 x daily - 7 x weekly -  1 sets - 1-2 reps - 2-3 minutes  hold  - Prone Press Up  - 1-3 x daily - 7 x weekly - 1 sets - 10 reps  - Supine Posterior Pelvic Tilt  - 1-3 x daily - 7 x weekly - 2 sets - 10 reps  - Supine Lower  Trunk Rotation  - 1-3 x daily - 7 x weekly - 2 sets - 10 reps    EXERCISE/ACTIVITY NAME REPETITIONS RESISTANCE COMPLETED THIS DOS   HEP Review   Y   Gentle SAHD L    Y   Prone on elbows  3 minutes   Y   Prone press ups:   With OP   Without   10x  15x  Y   LTR       Bridges       PPT       PPT with supine march       Clamshells                  ASSESSMENT    Impression: Patient is a 68 year old male referred to PT secondary to low back pain. Static assessment revealed: Loss of lordosis, scoliosis, forward head. Dynamic assessment revealed: decreased pain free lumbar AROM, normal BLE gross muscle strength, diminished DTR BLE, ( - ) special testing, positive response to gentle SAHD B and prone positioning. Patient will benefit from skilled PT to reduce subjective complaints, improve pain free lumbar AROM, improve BLE gross muscle strength, and reduce radicular symptoms in order to optimize functional mobility and maximize functional independence.      Rehab potential: FAIR      Short-Term Goals: 3 Weeks     - Patient will demonstrate improved lumbar AROM to at least 10 degrees ext     - Patient will be independent with progressive HEP to maximize gains from PT.     - Patient will report max 7 LBP to aid in participation in PT.     - Patient will report reduced radicular symptoms by 15% to aid in house ADLs.     - Patient will exhibit functional trunk mobility and strength to allow non-limiting bed mobility activities due to pain.      Long-Term Goals: 6 Weeks     - Patient will demonstrate improved tolerance to STS transition    - Patient will demonstrate improved functional ability via improved Patient Specific Functional Score to at least 8.     - Patient will report sleep not disrupted by back pain to aid in participation of functional ADLs during the day.     - Patient will report improved tolerance to functional activities without exacerbation of radicular symptoms     PLAN  Patient will attend 2 times per week x  6 weeks. Therapy may include, but is not limited to THERAPEUTIC EXERCISES, MYOFASCIAL/JOINT MOBILIZATION, POSTURE/BODY MECHANICS, TRANSFER/GAIT TRAINING, and HOME INSTRUCTIONS    Plan for next visit: Progress per flowsheet, assess tolerance to HEP.     Evaluation complexity:   Personal factors impacting POC: FREQUENT OR CHRONIC PAIN   Co-morbidities impacting POC: PREVIOUS SURGERIES  Complexity of physical exam: INCLUDING MUSCULOSKELETAL SYSTEM (POSTURE, ROM, STRENGTH, HEIGHT/WEIGHT), INCLUDING NEUROMUSCULAR EXAM (BALANCE, GAIT, LOCOMOTION, MOBILITY), and INCLUDING ACTIVITY/MOBILITY RESTRICTIONS   Clinical Presentation: STABLE   Evaluation Complexity: LOW-HISTORY 0, EXAMINATION 1-2, STABLE PRESENTATION    Total Session Time 60, Timed code minutes 20, and Untimed code minutes 40        Intervention  minutes: EVALUATION 40 minutes and THERAPEUTIC EXERCISE 20 minutes    Floy Hutch, PT  10/24/2023, 08:47

## 2023-10-27 ENCOUNTER — Other Ambulatory Visit: Payer: Self-pay

## 2023-10-27 ENCOUNTER — Ambulatory Visit (HOSPITAL_COMMUNITY)
Admission: RE | Admit: 2023-10-27 | Discharge: 2023-10-27 | Disposition: A | Discharge status: Still In Hospital | Payer: Self-pay | Source: Ambulatory Visit

## 2023-10-27 NOTE — PT Treatment (Signed)
 [facility] Medicine [facility]  Outpatient Physical Therapy  [address]  [place], [address]  [phone]  (Fax) [phone]    Physical Therapy Treatment Note    Date: 10/27/2023  Patient's Name: Travis Brooks  Date of Birth: 1956-05-05  Physical Therapy Visit    Visit #/POC: 2/12  Authorization: med nec  POC Signed?: no  POC Ends: 12/05/23  Order Ends: open  Next Progress Note Due: 10th visit or 11/27/23    Evaluating Physical Therapist: Floy Hutch, PT, DPT  PT diagnosis/Reason for Referral: LBP  Next Scheduled Physician Appointment: 01/2024  Allergies/Contraindications: n/a      Subjective: Patient reports good response to IE and HEP. No changes in symptoms. He states if he mows and weed eats grass he is unable to do any other activities for a few days. He states when amb he has radicular pain R anterior hip down R Quad into tibia, states this is not present when walking up/down steps only with normal walking. He reports pain upon arrival 4/10 stating he overdone his activity yesterday working on grandsons truck.     Objective: treatment delivered as noted below    EXERCISE/ACTIVITY NAME REPETITIONS RESISTANCE COMPLETED THIS DOS   HEP Review  HEP Access Code: [email or web address]   Exercises  - Prone on Elbows Stretch  - 1-3 x daily - 7 x weekly - 1 sets - 1-2 reps - 2-3 minutes  hold  - Prone Press Up  - 1-3 x daily - 7 x weekly - 1 sets - 10 reps  - Supine Posterior Pelvic Tilt  - 1-3 x daily - 7 x weekly - 2 sets - 10 reps  - Supine Lower Trunk Rotation  - 1-3 x daily - 7 x weekly - 2 sets - 10 reps     n   Gentle SAHD L      n   Prone on elbows    with MFR Y   Prone press ups:   With OP   Without    10x  15x   n   LTR   x5  with PB y   Bridges       n   PPT       n   PPT with supine march       y   Clamshells       n   MFR: bilat QL and lumbar paraspinals  QL release  R Psoas release   Y  Y  y       DISCONTINUED ACTIVITIES                              Assessment: While laying prone he reports  burning bilat feet. Following session patient amb and states his chief c/o (radicular pain anterior hip down into tibia) remains present. In response to that R Psoas release was performed and then upon standing post release his radicular symptoms have improved and only traveling to proximal aspect of Quads. Patient was instructed to pay attention to length of time radicular symptom is improved.    Short-Term Goals: 3 Weeks      - Patient will demonstrate improved lumbar AROM to at least 10 degrees ext     - Patient will be independent with progressive HEP to maximize gains from PT.     - Patient will report max 7 LBP to aid in participation in PT.     -  Patient will report reduced radicular symptoms by 15% to aid in house ADLs.     - Patient will exhibit functional trunk mobility and strength to allow non-limiting bed mobility activities due to pain.       Long-Term Goals: 6 Weeks      - Patient will demonstrate improved tolerance to STS transition    - Patient will demonstrate improved functional ability via improved Patient Specific Functional Score to at least 8.     - Patient will report sleep not disrupted by back pain to aid in participation of functional ADLs during the day.     - Patient will report improved tolerance to functional activities without exacerbation of radicular symptoms     Plan: Assess response to treatment and status of radicular symptoms.    Total Session Time 46, Timed code minutes 43, and Untimed code minutes 0  THERAPEUTIC EXERCISE 13 minutes and JOINT MOBILIZATION/MFR 30 minutes      Denise First, PTA  10/27/2023, 15:54

## 2023-10-31 ENCOUNTER — Ambulatory Visit (HOSPITAL_COMMUNITY)
Admission: RE | Admit: 2023-10-31 | Discharge: 2023-10-31 | Disposition: A | Discharge status: Still In Hospital | Payer: Self-pay | Source: Ambulatory Visit

## 2023-10-31 ENCOUNTER — Other Ambulatory Visit: Payer: Self-pay

## 2023-10-31 NOTE — PT Treatment (Signed)
 [facility] Medicine [facility]  Outpatient Physical Therapy  [address]  [place], [address]  [phone]  (Fax) [phone]    Physical Therapy Treatment Note    Date: 10/31/2023  Patient's Name: Travis Brooks  Date of Birth: 05/25/1955  Physical Therapy Visit    Visit #/POC: 3/12  Authorization: med Thais Fill  POC Signed?: no  POC Ends: 12/05/23  Order Ends: open  Next Progress Note Due: 10th visit or 11/27/23    Evaluating Physical Therapist: Floy Hutch, PT, DPT  PT diagnosis/Reason for Referral: LBP  Next Scheduled Physician Appointment: 01/2024  Allergies/Contraindications: n/a      Subjective: Patient reports response to last visit: radicular pain remained resolved half that day and upon returning  was only present distal 1/3 of Quad. Pain with amb is 5/10 and states there were times he caught himself walking without a limp.     Objective: treatment delivered as noted below    EXERCISE/ACTIVITY NAME REPETITIONS RESISTANCE COMPLETED THIS DOS   HEP Review  HEP Access Code: [email or web address]   Exercises  - Prone on Elbows Stretch  - 1-3 x daily - 7 x weekly - 1 sets - 1-2 reps - 2-3 minutes  hold  - Prone Press Up  - 1-3 x daily - 7 x weekly - 1 sets - 10 reps  - Supine Posterior Pelvic Tilt  - 1-3 x daily - 7 x weekly - 2 sets - 10 reps  - Supine Lower Trunk Rotation  - 1-3 x daily - 7 x weekly - 2 sets - 10 reps     n   Gentle SAHD L      n   Prone on elbows    with MFR n   Prone press ups:   With OP   Without    10x  15x   n   LTR   DKC with PB  x10  x10  with PB  With PB y  y   Resisted LTR with PB x10 Green tband y   Deweese       n   PPT       n   PPT with supine march       y   Clamshells       n   MFR: bilat QL and lumbar paraspinals  QL release  R Psoas release   n  n  n   Kinesio tape: inhibit lumbar/thoracic paraspinal I strip x2  y   Hooklying UE pull down resisted with abdominal hold x10 Green Tband y   Quadruped: alternating LE and UE x5  y             DISCONTINUED ACTIVITIES                               Assessment: Progressed core strengthening this visit with very good tolerance. Upon leaving he states 0/10 LBP and no radicular pain. Trial kinesio tape to inhibit lumbar/thoracic paraspinals with wear/care instructions given.     Short-Term Goals: 3 Weeks      - Patient will demonstrate improved lumbar AROM to at least 10 degrees ext     - Patient will be independent with progressive HEP to maximize gains from PT.     - Patient will report max 7 LBP to aid in participation in PT.     - Patient will report reduced radicular symptoms by  15% to aid in house ADLs.     - Patient will exhibit functional trunk mobility and strength to allow non-limiting bed mobility activities due to pain.       Long-Term Goals: 6 Weeks      - Patient will demonstrate improved tolerance to STS transition    - Patient will demonstrate improved functional ability via improved Patient Specific Functional Score to at least 8.     - Patient will report sleep not disrupted by back pain to aid in participation of functional ADLs during the day.     - Patient will report improved tolerance to functional activities without exacerbation of radicular symptoms     Plan: Assess length of time his symptoms remained resolved.     Total Session Time 49, Timed code minutes 49, and Untimed code minutes 0  Therex x49 min      Denise First, PTA  11/01/2023 12:38 (late entry for [facility] 10/31/23)

## 2023-11-02 ENCOUNTER — Other Ambulatory Visit: Payer: Self-pay

## 2023-11-02 ENCOUNTER — Ambulatory Visit (HOSPITAL_COMMUNITY)
Admission: RE | Admit: 2023-11-02 | Discharge: 2023-11-02 | Disposition: A | Discharge status: Still In Hospital | Payer: Self-pay | Source: Ambulatory Visit

## 2023-11-02 NOTE — PT Treatment (Signed)
 [facility] Medicine [facility]  Outpatient Physical Therapy  [address]  [place], [address]  [phone]  (Fax) [phone]    Physical Therapy Treatment Note    Date: 11/02/2023  Patient's Name: Travis Brooks  Date of Birth: 1955-06-11  Physical Therapy Visit    Visit #/POC: 4/12  Authorization: med nec  POC Signed?: no  POC Ends: 12/05/23  Order Ends: open  Next Progress Note Due: 10th visit or 11/27/23     Evaluating Physical Therapist: Floy Hutch, PT, DPT  PT diagnosis/Reason for Referral: LBP  Next Scheduled Physician Appointment: 01/2024  Allergies/Contraindications: n/a        Subjective: Patient states his pain is still a 0/10. Will occasionally have some pain on this thigh but does not last long. Tape still intact.      Objective: treatment delivered as noted below    Patient-Specific Functional Score:     Problem Score 5/12   1. Lifting heavy objects  5 5   2. Repetitive bending  5 7   Total 5 6   Total score = sum of the activity scores/number of activities    Minimal detectable change (90% CI) for avg score = 2 points    Minimal detectable change (90% CI) for single activity score = 3 points             AROM    Right 5/12  Left 5/12    Flexion 75 NT   Extension 12 NT   Sidebend 30 32   Rotation NT NT          EXERCISE/ACTIVITY NAME REPETITIONS RESISTANCE COMPLETED THIS DOS   HEP Review  HEP Access Code: [email or web address]   Exercises  - Prone on Elbows Stretch  - 1-3 x daily - 7 x weekly - 1 sets - 1-2 reps - 2-3 minutes  hold  - Prone Press Up  - 1-3 x daily - 7 x weekly - 1 sets - 10 reps  - Supine Posterior Pelvic Tilt  - 1-3 x daily - 7 x weekly - 2 sets - 10 reps  - Supine Lower Trunk Rotation  - 1-3 x daily - 7 x weekly - 2 sets - 10 reps     n   Gentle SAHD L      n   Prone on elbows     with MFR n   Prone press ups:   With OP   Without    10x  15x   n   LTR   DKC with PB  x10  x10  with PB  With PB y  y   Resisted LTR with PB x10 Green tband y   [place]       n   PPT       n    PPT with supine march   10  green  y   Clamshells       n   MFR: bilat QL and lumbar paraspinals  QL release  R Psoas release     n  n  n   Kinesio tape: inhibit lumbar/thoracic paraspinal I strip x2   y   Hooklying UE pull down resisted with abdominal hold x10 Green Tband y   Quadruped: alternating LE and UE x5   y                   DISCONTINUED ACTIVITIES  Assessment: Good tolerance of Rx. Improvements in ROM and PSFS, and with radicular symptoms in LE.      Short-Term Goals: 3 Weeks      - Patient will demonstrate improved lumbar AROM to at least 10 degrees ext. Progressing 5/12    - Patient will be independent with progressive HEP to maximize gains from PT. MET 5/12    - Patient will report max 7 LBP to aid in participation in PT. MET 5/12    - Patient will report reduced radicular symptoms by 15% to aid in house ADLs.     - Patient will exhibit functional trunk mobility and strength to allow non-limiting bed mobility activities due to pain.       Long-Term Goals: 6 Weeks      - Patient will demonstrate improved tolerance to STS transition    - Patient will demonstrate improved functional ability via improved Patient Specific Functional Score to at least 8. Progressing 5/12    - Patient will report sleep not disrupted by back pain to aid in participation of functional ADLs during the day.     - Patient will report improved tolerance to functional activities without exacerbation of radicular symptoms      Plan: Cont to progress with core strengthening.    Total Session Time 35, Timed code minutes 35, and Untimed code minutes 0  THERAPEUTIC EXERCISE 35 minutes      Cherylene Corrente, PTA  11/02/2023, 10:19

## 2023-11-07 ENCOUNTER — Other Ambulatory Visit: Payer: Self-pay

## 2023-11-07 ENCOUNTER — Ambulatory Visit (HOSPITAL_COMMUNITY)
Admission: RE | Admit: 2023-11-07 | Discharge: 2023-11-07 | Disposition: A | Discharge status: Still In Hospital | Payer: Self-pay | Source: Ambulatory Visit

## 2023-11-07 NOTE — PT Treatment (Signed)
 [facility] Medicine [facility]  Outpatient Physical Therapy  [address]  [place], [address]  [phone]  (Fax) [phone]    Physical Therapy Treatment Note    Date: 11/07/2023  Patient's Name: Travis Brooks  Date of Birth: 01/25/1956  Physical Therapy Visit    Visit #/POC: 5/12  Authorization: med [employer]  POC Signed?: no  POC Ends: 12/05/23  Order Ends: open  Next Progress Note Due: 10th visit or 11/27/23     Evaluating Physical Therapist: Floy Hutch, PT, DPT  PT diagnosis/Reason for Referral: LBP  Next Scheduled Physician Appointment: 02/10/24  Allergies/Contraindications: n/a        Subjective: Patient notes reduced frequency and intensity of BLE symptoms. Patient notes centralized low back pain which radiates into his hips in the morning. Patient notes this resolves as he gets up and gets moving. Patient reports good tolerance to HEP but notes some soreness after. Patient denies soreness following PT. Patient also reports reduced frequency/intensity of numbness in BLE.      Objective: treatment delivered as noted below     Patient-Specific Functional Score:     Problem Score 6/12   1. Lifting heavy objects  5 5   2. Repetitive bending  5 7   Total 5 6   Total score = sum of the activity scores/number of activities    Minimal detectable change (90% CI) for avg score = 2 points    Minimal detectable change (90% CI) for single activity score = 3 points              AROM    Right 6/12  Left 6/12    Flexion 75 NT   Extension 12 NT   Sidebend 30 32   Rotation NT NT           EXERCISE/ACTIVITY NAME REPETITIONS RESISTANCE COMPLETED THIS DOS   HEP Review  HEP Access Code: [email or web address]   Exercises  - Prone on Elbows Stretch  - 1-3 x daily - 7 x weekly - 1 sets - 1-2 reps - 2-3 minutes  hold  - Prone Press Up  - 1-3 x daily - 7 x weekly - 1 sets - 10 reps  - Supine Posterior Pelvic Tilt  - 1-3 x daily - 7 x weekly - 2 sets - 10 reps  - Supine Lower Trunk Rotation  - 1-3 x daily - 7 x weekly - 2 sets -  10 reps     n   Gentle SAHD L      n   Prone on elbows     with MFR n   Prone press ups:   With OP   Without    2x10  15x     Y  N   Right lumbar side glide at wall 2x10  Y   LTR   DKC with PB 10xea  x10  with PB  With PB y  y   Resisted LTR with PB x10 Green tband y   Bridges   2x10    Y   PPT       n   PPT with supine march   2x10  green  y   Clamshells    2x10   Y   MFR: bilat QL and lumbar paraspinals  QL release  R Psoas release     n  n  n   Kinesio tape: inhibit lumbar/thoracic paraspinal I strip x2      Hooklying  UE pull down resisted with abdominal hold 2x10 Green Tband y   Quadruped: alternating LE and UE 10xea   y                   DISCONTINUED ACTIVITIES                                                Assessment: Patient demos good tolerance to exercise program this date. Patient continues to note left sided catch pain when returning to neutral from flexed postures. Patient notes recreation of catch pain with right lumbar side bending. Patient notes reduction of catch pain with lumbar extension with OP.      Short-Term Goals: 3 Weeks      - Patient will demonstrate improved lumbar AROM to at least 10 degrees ext. Progressing 5/12    - Patient will be independent with progressive HEP to maximize gains from PT. MET 5/12    - Patient will report max 7 LBP to aid in participation in PT. MET 5/12    - Patient will report reduced radicular symptoms by 15% to aid in house ADLs.     - Patient will exhibit functional trunk mobility and strength to allow non-limiting bed mobility activities due to pain.       Long-Term Goals: 6 Weeks      - Patient will demonstrate improved tolerance to STS transition    - Patient will demonstrate improved functional ability via improved Patient Specific Functional Score to at least 8. Progressing 5/12    - Patient will report sleep not disrupted by back pain to aid in participation of functional ADLs during the day.     - Patient will report improved tolerance to functional  activities without exacerbation of radicular symptoms      Plan: Progress as tolerated     Total Session Time 42 and Timed code minutes 42  THERAPEUTIC EXERCISE 42 minutes      Floy Hutch, PT  11/07/2023, 08:48

## 2023-11-09 ENCOUNTER — Other Ambulatory Visit: Payer: Self-pay

## 2023-11-09 ENCOUNTER — Ambulatory Visit (HOSPITAL_COMMUNITY)
Admission: RE | Admit: 2023-11-09 | Discharge: 2023-11-09 | Disposition: A | Discharge status: Still In Hospital | Payer: Self-pay | Source: Ambulatory Visit

## 2023-11-09 NOTE — PT Treatment (Signed)
 [facility]  Outpatient Physical Therapy  [address]  [place], [address]  [phone]  (Fax) [phone]    Physical Therapy Treatment Note    Date: 11/09/2023  Patient's Name: Travis Brooks  Date of Birth: November 12, 1955  Physical Therapy Visit    Visit #/POC: 6/12  Authorization: med nec  POC Signed?: no  POC Ends: 12/05/23  Order Ends: open  Next Progress Note Due: 10th visit or 11/27/23     Evaluating Physical Therapist: Floy Hutch, PT, DPT  PT diagnosis/Reason for Referral: LBP  Next Scheduled Physician Appointment: 02/10/24  Allergies/Contraindications: n/a        Subjective: Patient reports his chief c/o is pain/tightness in LB with prolonged sitting and upon standing following prolonged sitting.      Objective: treatment delivered as noted below     Patient-Specific Functional Score:     Problem Score 6/12   1. Lifting heavy objects  5 5   2. Repetitive bending  5 7   Total 5 6   Total score = sum of the activity scores/number of activities    Minimal detectable change (90% CI) for avg score = 2 points    Minimal detectable change (90% CI) for single activity score = 3 points              AROM    Right 6/12  Left 6/12    Flexion 75 NT   Extension 12 NT   Sidebend 30 32   Rotation NT NT           EXERCISE/ACTIVITY NAME REPETITIONS RESISTANCE COMPLETED THIS DOS   HEP Review  HEP Access Code: [email or web address]   Exercises  - Prone on Elbows Stretch  - 1-3 x daily - 7 x weekly - 1 sets - 1-2 reps - 2-3 minutes  hold  - Prone Press Up  - 1-3 x daily - 7 x weekly - 1 sets - 10 reps  - Supine Posterior Pelvic Tilt  - 1-3 x daily - 7 x weekly - 2 sets - 10 reps  - Supine Lower Trunk Rotation  - 1-3 x daily - 7 x weekly - 2 sets - 10 reps     n   Gentle SAHD L      n   Prone on elbows     with MFR n   Prone press ups:   With OP   Without    2x10  15x     n  N   Right lumbar side glide at wall 2x10  n   LTR   DKC with PB 10xea  x10  with PB  With PB n  n   Resisted LTR with PB X10 ea  Blue tband y   Bridges   2x10    n   PPT   in seated    y   PPT with supine march   2x10  green  n   Clamshells    2x10   n   MFR: bilat QL and lumbar paraspinals  QL release  R Psoas release     n  n  n   Kinesio tape: inhibit lumbar/thoracic paraspinal I strip x2      Hooklying UE pull down resisted with abdominal hold 2x10 Green Tband n   Quadruped: alternating LE and UE 10xea   n    nustep X10 min L4 y   Lumbar extension machine x15 30# y  Hip abd machine x15 45# y   DL press  27# y   Standing low rows  Standing rows x20 Green Tband  Green Tband Y  y   Resisted amb in // bars: fwd/back/ bilat sidestepping X5 passes  [employer] tubing y         DISCONTINUED ACTIVITIES                                                Assessment: Instructed patient on performing seated PPT during long periods of sitting and especially just prior to standing following prolonged  sitting to assess if that helps pain in LB. Worked also with progressive core and hip strengthening.    Short-Term Goals: 3 Weeks      - Patient will demonstrate improved lumbar AROM to at least 10 degrees ext. Progressing 5/12    - Patient will be independent with progressive HEP to maximize gains from PT. MET 5/12    - Patient will report max 7 LBP to aid in participation in PT. MET 5/12    - Patient will report reduced radicular symptoms by 15% to aid in house ADLs.     - Patient will exhibit functional trunk mobility and strength to allow non-limiting bed mobility activities due to pain.       Long-Term Goals: 6 Weeks      - Patient will demonstrate improved tolerance to STS transition    - Patient will demonstrate improved functional ability via improved Patient Specific Functional Score to at least 8. Progressing 5/12    - Patient will report sleep not disrupted by back pain to aid in participation of functional ADLs during the day.     - Patient will report improved tolerance to functional activities without exacerbation of radicular symptoms      Plan:   Assess response to progressive strengthening. Will review current HEP and HEP that he has from previous treatment episodes.     Total Session Time 43 and Timed code minutes 43  THERAPEUTIC EXERCISE 43 minutes      Denise First, PTA  11/09/2023 09:36

## 2023-11-14 ENCOUNTER — Other Ambulatory Visit: Payer: Self-pay

## 2023-11-14 ENCOUNTER — Ambulatory Visit (HOSPITAL_COMMUNITY)
Admission: RE | Admit: 2023-11-14 | Discharge: 2023-11-14 | Disposition: A | Discharge status: Still In Hospital | Payer: Self-pay | Source: Ambulatory Visit

## 2023-11-14 NOTE — PT Treatment (Signed)
 [facility] Medicine [facility]  Outpatient Physical Therapy  [address]  [place], [address]  [phone]  (Fax) [phone]    Physical Therapy Treatment Note    Date: 11/14/2023  Patient's Name: Travis Brooks  Date of Birth: 1956-03-05  Physical Therapy Visit    Visit #/POC: 7/12  Authorization: med marlyce  POC Signed?: no  POC Ends: 12/05/23  Order Ends: open  Next Progress Note Due: 10th visit or 11/27/23     Evaluating Physical Therapist: Chauncey Kalata, PT, DPT  PT diagnosis/Reason for Referral: LBP  Next Scheduled Physician Appointment: 02/10/24  Allergies/Contraindications: n/a        Subjective: Patient reports he done a lot of sitting yesterday d/t excessive heat and experienced a flare up for a few hrs with pain in RLE along IT band region that he describes as deep almost in the bone. He also reports he woke middle of the night with BLE knee to foot feeling as though they had great pressure on them, he states he got up and walked around for ~1-1.5 hrs before they improved. States he performed seated PPT and thinks this may have helped reduce pain some, but not fully sure.      Objective: treatment delivered as noted below     Patient-Specific Functional Score:     Problem Score 6/12   1. Lifting heavy objects  5 5   2. Repetitive bending  5 7   Total 5 6   Total score = sum of the activity scores/number of activities    Minimal detectable change (90% CI) for avg score = 2 points    Minimal detectable change (90% CI) for single activity score = 3 points              AROM    Right 6/12  Left 6/12    Flexion 75 NT   Extension 12 NT   Sidebend 30 32   Rotation NT NT           EXERCISE/ACTIVITY NAME REPETITIONS RESISTANCE COMPLETED THIS DOS   HEP Review  HEP Access Code: [email or web address]   Exercises  - Prone on Elbows Stretch  - 1-3 x daily - 7 x weekly - 1 sets - 1-2 reps - 2-3 minutes  hold  - Prone Press Up  - 1-3 x daily - 7 x weekly - 1 sets - 10 reps  - Supine Posterior Pelvic Tilt  - 1-3  x daily - 7 x weekly - 2 sets - 10 reps  - Supine Lower Trunk Rotation  - 1-3 x daily - 7 x weekly - 2 sets - 10 reps     Updated HEP 11/14/23   Gentle SAHD L      n   Prone on elbows     with MFR n   Prone press ups:   With OP   Without    2x10  15x     n  N   Right lumbar side glide at wall 2x10  n   LTR   DKC with PB 10xea  x10  with PB  With PB n  n   Resisted LTR with PB X10 ea Blue tband n   Bridges   2x10    n   PPT   in seated    n   PPT with supine march   2x10  green  n   Clamshells    2x10   n  MFR: bilat QL and lumbar paraspinals  QL release  R Psoas release     n  n  n   Kinesio tape: inhibit lumbar/thoracic paraspinal I strip x2      Hooklying UE pull down resisted with abdominal hold 2x10 Green Tband n   Quadruped: alternating LE and UE 10xea   n    nustep X10 min L4 y   Lumbar extension machine x15 30# n   Hip abd machine x15 45# n   DL press  19# n   Standing low rows  Standing rows x20 Green Tband  Green Tband n  n   Resisted amb in // bars: fwd/back/ bilat sidestepping X5 passes  [employer] tubing n   Updated HEP with performance   y   Supine HS stretch with sciatic nerve glide X10 sec with            DISCONTINUED ACTIVITIES                                                Assessment: Worked with accumulating comprehensive HEP as he has been performing from various HEP's given from past PT episodes of care. Did add supine HS stretch in conjunction with sciatic nerve glide and he was instructed to perform this along with piriformis stretch (previously given) to address symptoms such as he experienced yesterday and during the night.     Short-Term Goals: 3 Weeks      - Patient will demonstrate improved lumbar AROM to at least 10 degrees ext. Progressing 5/12    - Patient will be independent with progressive HEP to maximize gains from PT. MET 5/12    - Patient will report max 7 LBP to aid in participation in PT. MET 5/12    - Patient will report reduced radicular symptoms by 15% to aid in house ADLs.     -  Patient will exhibit functional trunk mobility and strength to allow non-limiting bed mobility activities due to pain.       Long-Term Goals: 6 Weeks      - Patient will demonstrate improved tolerance to STS transition    - Patient will demonstrate improved functional ability via improved Patient Specific Functional Score to at least 8. Progressing 5/12    - Patient will report sleep not disrupted by back pain to aid in participation of functional ADLs during the day.     - Patient will report improved tolerance to functional activities without exacerbation of radicular symptoms      Plan:  Assess response to progressive HEP.    Total Session Time 52 and Timed code minutes 52  THERAPEUTIC EXERCISE 52 minutes      Alfonso Ka, PTA  11/14/2023 10:38

## 2023-11-16 ENCOUNTER — Other Ambulatory Visit: Payer: Self-pay

## 2023-11-16 ENCOUNTER — Ambulatory Visit
Admission: RE | Admit: 2023-11-16 | Discharge: 2023-11-16 | Disposition: A | Discharge status: Still In Hospital | Payer: Self-pay | Source: Ambulatory Visit | Attending: Family Medicine | Admitting: Family Medicine

## 2023-11-16 NOTE — PT Treatment (Signed)
 [facility] Medicine [facility]  Outpatient Physical Therapy  [address]  [place], [address]  [phone]  (Fax) [phone]    Physical Therapy Treatment Note    Date: 11/16/2023  Patient's Name: Travis Brooks  Date of Birth: 06/15/1955  Physical Therapy Visit    Visit #/POC: 8/12  Authorization: med marlyce  POC Signed?: no  POC Ends: 12/05/23  Order Ends: open  Next Progress Note Due: 10th visit or 11/27/23     Evaluating Physical Therapist: Chauncey Kalata, PT, DPT  PT diagnosis/Reason for Referral: LBP  Next Scheduled Physician Appointment: 02/10/24  Allergies/Contraindications: n/a        Subjective: Reports no matter what therapy has done he still has that one spot in his LB that is unchanged and point to sacrum. He continues to report experiencing intermittent pain in R thigh.     Objective: treatment delivered as noted below     Patient-Specific Functional Score:     Problem Score 6/12   1. Lifting heavy objects  5 5   2. Repetitive bending  5 7   Total 5 6   Total score = sum of the activity scores/number of activities    Minimal detectable change (90% CI) for avg score = 2 points    Minimal detectable change (90% CI) for single activity score = 3 points              AROM    Right 6/12  Left 6/12    Flexion 75 NT   Extension 12 NT   Sidebend 30 32   Rotation NT NT           EXERCISE/ACTIVITY NAME REPETITIONS RESISTANCE COMPLETED THIS DOS   HEP Review  HEP Access Code: [email or web address]   Exercises  - Prone on Elbows Stretch  - 1-3 x daily - 7 x weekly - 1 sets - 1-2 reps - 2-3 minutes  hold  - Prone Press Up  - 1-3 x daily - 7 x weekly - 1 sets - 10 reps  - Supine Posterior Pelvic Tilt  - 1-3 x daily - 7 x weekly - 2 sets - 10 reps  - Supine Lower Trunk Rotation  - 1-3 x daily - 7 x weekly - 2 sets - 10 reps     Updated HEP 11/14/23   Gentle SAHD L      n   Prone on elbows     with MFR n   Prone press ups:   With OP   Without    2x10  15x     n  N   Right lumbar side glide at wall 2x10  n   LTR    DKC with PB 10xea  x10  with PB  With PB n  n   Resisted LTR with PB X10 ea Blue tband n   Bridges   2x10    n   PPT   in seated    n   PPT with supine march   2x10  green  n   Clamshells    2x10   n   MFR: bilat QL and lumbar paraspinals  QL release  R Psoas release     n  n  n   Kinesio tape: inhibit lumbar/thoracic paraspinal I strip x2      Hooklying UE pull down resisted with abdominal hold 2x10 Green Tband n   Quadruped: alternating LE and UE 10xea   n  nustep X10 min L4 y   Lumbar extension machine x30 30# y   Hip abd machine x15 45# n   DL press  19# n   Standing low rows  Standing rows x20 Green Tband  Green Tband n  n   Resisted amb in // bars: fwd/back/ bilat sidestepping X5 passes  [employer] tubing n   Updated HEP with performance   n   Supine HS stretch with sciatic nerve glide X10 sec with   n   Pelvic and sacral assessment:   MET  Anterior sacral mob in prone   X2 cycles  X2 cycles    Y  y   R Psoas and iliacus release   y   Heel lift to R shoe 2 ply  y               DISCONTINUED ACTIVITIES                                                Assessment: Attempted to address sacral pain via mob and MET. With sacral assessment R is slightly higher than L and RLE is shorter vs L. However, no changes noted when correction techniques were performed. 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 Heel lift was placed in R shoe, first 1 ply then 2 ply with patient feeling the 2 ply seems to feel better to him. He was instructed to monitor his response to heel lift and report back next visit. He states typically lumbar extension feels better vs flexion, but when in the shower if he leans fwd and allows hot water  to run over his LB in this position it does reduce pain. During Psoas release he states having the feeling of hot water  running down his back.     Short-Term Goals: 3 Weeks      - Patient will demonstrate improved lumbar AROM to at least 10 degrees  ext. Progressing 5/12    - Patient will be independent with progressive HEP to maximize gains from PT. MET 5/12    - Patient will report max 7 LBP to aid in participation in PT. MET 5/12    - Patient will report reduced radicular symptoms by 15% to aid in house ADLs.     - Patient will exhibit functional trunk mobility and strength to allow non-limiting bed mobility activities due to pain.       Long-Term Goals: 6 Weeks      - Patient will demonstrate improved tolerance to STS transition    - Patient will demonstrate improved functional ability via improved Patient Specific Functional Score to at least 8. Progressing 5/12    - Patient will report sleep not disrupted by back pain to aid in participation of functional ADLs during the day.     - Patient will report improved tolerance to functional activities without exacerbation of radicular symptoms      Plan:  Assess response treatment and trial heel lift to R.     Total Session Time 43 and Timed code minutes 43  THERAPEUTIC EXERCISE 10 minutesMFR x33 min      Alfonso Ka, PTA  11/16/2023 09:10

## 2023-11-21 ENCOUNTER — Other Ambulatory Visit: Payer: Self-pay

## 2023-11-21 ENCOUNTER — Ambulatory Visit (HOSPITAL_COMMUNITY)
Admission: RE | Admit: 2023-11-21 | Discharge: 2023-11-21 | Disposition: A | Discharge status: Still In Hospital | Payer: Self-pay | Source: Ambulatory Visit

## 2023-11-21 DIAGNOSIS — M545 Low back pain, unspecified: Secondary | ICD-10-CM | POA: Insufficient documentation

## 2023-11-21 DIAGNOSIS — M5416 Radiculopathy, lumbar region: Secondary | ICD-10-CM | POA: Insufficient documentation

## 2023-11-21 NOTE — Progress Notes (Signed)
 [facility] Medicine [facility]  Outpatient Physical Therapy  [address]  [place], [address]  [phone]  (Fax) [phone]    Physical Therapy Progress Note    Date: 11/21/2023  Patient's Name: Travis Brooks  Date of Birth: 06-07-1955  Physical Therapy Progress Note     Visit #/POC: 9/12  Authorization: med marlyce  POC Signed?: no  POC Ends: 12/05/23  Order Ends: open  Next Progress Note Due: visit 12   Evaluating Physical Therapist: Chauncey Kalata, PT, DPT  PT diagnosis/Reason for Referral: LBP  Next Scheduled Physician Appointment: 02/10/24,   Allergies/Contraindications: n/a        Subjective: Patient reports left hip pain this morning which he rates as a 4/10 prior to today's session. Patient continues to note central low back with radiation into right side of his low back. Patient reports he is no longer getting sore with HEP. Patient notes no change in symptoms with use of heel lift. Patient reports 50% improvement to radicular symptoms and no improvement to low back pain. Patient does note reduced frequency of night time pain. Patient denies change to pain in feet. Patient notes thigh symptoms are now sporadic.      Objective: treatment delivered as noted below     Patient-Specific Functional Score:     Problem Score 11/02/23 11/21/23   1. Lifting heavy objects  5 5 5    2. Repetitive bending  5 7 5    Total 5 6 5    Total score = sum of the activity scores/number of activities    Minimal detectable change (90% CI) for avg score = 2 points    Minimal detectable change (90% CI) for single activity score = 3 points               AROM    Right IE Right 6/12  Right 7/1 Left IE Left 6/12  Left 7/1   Flexion 70 75 72 NT NT NT   Extension 5 12 12  NT NT NT   Sidebend 23 30 20  32 32 25   Rotation NT NT NT NT NT NT   Comments: Patient notes increased central LBP with right radiation going into flexion and returning, Patient notes right lateral pain with extension, patient reports central lumbar  tightness with side bending bilaterally.     Strength (Manual Muscle Testing per [employer] Muscle Grading system)       Right IE  Right 7/1 Left IE  Left 7/1    Hip flexion (L1,2) 4+/5 4+/5 5/5 5/5 p!   Hip extension (L5,S1,2) NT NT NT NT   Knee flexion (S1) 5/5 5/5 5/5 5/5   Knee extension (L2-4) 5/5 5/5 5/5 5/5   Ankle DF (L4-5) 5/5 5/5 5/5 5/5   Ankle PF (S1,2) 5/5 5/5 5/5 5/5   Strength comments:     Reflexes      IE  7/1   Patellar 1+ B 2+ B   Achilles 1+ B  2+ B    Clonus  NT          Special Testing:   ( + ) SLUMP TEST BLE   ( + ) SLR < 70 degrees LLE        EXERCISE/ACTIVITY NAME REPETITIONS RESISTANCE COMPLETED THIS DOS   HEP Review  HEP Access Code: [email or web address]      Updated HEP 11/14/23   Gentle SAHD L      n   Prone on elbows  with MFR n   Prone press ups:   With OP   Without    2x10  15x      n  N   Right lumbar side glide at wall 2x10   n   LTR   DKC with PB 10xea  x10  with PB  With PB n  n   Resisted LTR with PB X10 ea Blue tband n   Bridges   2x10    n   PPT   in seated    n   PPT with supine march   2x10  green  n   Clamshells    2x10   n   MFR: bilat QL and lumbar paraspinals  QL release  R Psoas release     n  n  n   Kines[employer] tape: inhibit lumbar/thoracic paraspinal I strip x2       Hooklying UE pull down resisted with abdominal hold 2x10 Green Tband n   Quadruped: alternating LE and UE 10xea   n    nustep X10 min L4 y   Lumbar extension machine x30 30# y   Hip abd machine x15 45# n   DL press   19# n   Standing low rows  Standing rows x20 Green Tband  Green Tband n  n   Resisted amb in // bars: fwd/back/ bilat sidestepping X5 passes  [employer] tubing n   Updated HEP with performance     n   sciatic nerve glide 20x   Y   Pelvic and sacral assessment:   MET  Anterior sacral mob in prone    X2 cycles  X2 cycles      N  N   R Psoas and iliacus release     N   Heel lift to R shoe 2 ply   N    Objective Reassessment      Y         DISCONTINUED ACTIVITIES                                                 Assessment: Patient has made fair progress towards established short and long term PT goals. Patient continues to note central low back pain with right radiation and sporadic BLE symptoms. Patient notes reduced frequency of radicular symptoms but changed LBP. Patient continues to note increased pain with higher level physical activity. Will continue for 3 additional visits to complete POC.     Short-Term Goals: 3 Weeks      - Patient will demonstrate improved lumbar AROM to at least 10 degrees ext.  ( MET 11/02/23 )    - Patient will be independent with progressive HEP to maximize gains from PT.  ( MET 11/02/23)     - Patient will report max 7 LBP to aid in participation in PT. MET 6/12    - Patient will report reduced radicular symptoms by 15% to aid in house ADLs. ( MET 11/21/23 )     - Patient will exhibit functional trunk mobility and strength to allow non-limiting bed mobility activities due to pain.  ( PROGRESSING 11/21/23 )      Long-Term Goals: 6 Weeks      - Patient will demonstrate improved tolerance to STS transition ( PROGRESSING 11/21/23 )     - Patient will demonstrate  improved functional ability via improved Patient Specific Functional Score to at least 8. ( NOT MET 11/21/23 )     - Patient will report sleep not disrupted by back pain to aid in participation of functional ADLs during the day. ( PROGRESSING 11/21/23 )     - Patient will report improved tolerance to functional activities without exacerbation of radicular symptoms ( NOT MET 11/21/23 )      Plan:  Will Reassess for D/C on visit 12     Total Session Time 45 and Timed code minutes 45  THERAPEUTIC EXERCISE 45 minutes      Chauncey Kalata, PT  11/21/2023, 08:00

## 2023-11-23 ENCOUNTER — Other Ambulatory Visit: Payer: Self-pay

## 2023-11-23 ENCOUNTER — Ambulatory Visit (HOSPITAL_COMMUNITY)
Admission: RE | Admit: 2023-11-23 | Discharge: 2023-11-23 | Disposition: A | Discharge status: Still In Hospital | Payer: Self-pay | Source: Ambulatory Visit

## 2023-11-23 NOTE — PT Treatment (Signed)
 [facility] Medicine [facility]  Outpatient Physical Therapy  [address]  [place], [address]  [phone]  (Fax) [phone]    Physical Therapy Treatment Note    Date: 11/23/2023  Patient's Name: Travis Brooks  Date of Birth: 12-02-1955  Physical Therapy Visit    Visit #/POC: 10/12  Authorization: med marlyce  POC Signed?: no  POC Ends: 12/05/23  Order Ends: open  Next Progress Note Due: visit 12   Evaluating Physical Therapist: Chauncey Kalata, PT, DPT  PT diagnosis/Reason for Referral: LBP  Next Scheduled Physician Appointment: 02/10/24,   Allergies/Contraindications: n/a        Subjective: No change in complaints. Notes he has more soreness/stiffness when he first wakes up or if he has been immobile for a period of time, but that the pain gets better as he moves. He did note that he does have increased pain when performing prone press ups.      Objective: treatment delivered as noted below        EXERCISE/ACTIVITY NAME REPETITIONS RESISTANCE COMPLETED THIS DOS   HEP Review  HEP Access Code: [email or web address]      Updated HEP 11/14/23   Gentle SAHD L      n               Right lumbar side glide at wall 2x10   n   LTR   DKC with PB 10xea  x10  with PB  With PB n  n   Resisted LTR with PB X10 ea Blue tband n   Bridges   2x10    n   PPT   in seated    n   PPT with supine march   2x10  green  n   Clamshells    2x10   n   MFR: bilat QL and lumbar paraspinals  QL release  R Psoas release     n  n  Y  Y    Kinesio tape: inhibit lumbar/thoracic paraspinal I strip x2       Hooklying UE pull down resisted with abdominal hold 2x10 Green Tband n   Quadruped: alternating LE and UE 10xea   n    nustep X10 min L4 y   Lumbar extension machine x30 30# y   Hip abd machine x15 45# n   DL press   19# n   Standing low rows  Standing rows x20 Green Tband  Green Tband n  n   Resisted amb in // bars: fwd/back/ bilat sidestepping X5 passes  [employer] tubing n   Updated HEP with performance     n   sciatic nerve glide 20x   Y   Prone  leg pull   y   Pelvic and sacral assessment:   MET  Anterior sacral mob in prone    X2 cycles  X2 cycles      N  N   R Psoas and iliacus release     N   Heel lift to R shoe 2 ply   N    Objective Reassessment      Y         DISCONTINUED ACTIVITIES             prone press ups                                   Assessment: Tol session  well, with no adverse effects noted. Did respond well with prone leg pull.      Short-Term Goals: 3 Weeks      - Patient will demonstrate improved lumbar AROM to at least 10 degrees ext.  ( MET 11/02/23 )    - Patient will be independent with progressive HEP to maximize gains from PT.  ( MET 11/02/23)     - Patient will report max 7 LBP to aid in participation in PT. MET 6/12    - Patient will report reduced radicular symptoms by 15% to aid in house ADLs. ( MET 11/21/23 )     - Patient will exhibit functional trunk mobility and strength to allow non-limiting bed mobility activities due to pain.  ( PROGRESSING 11/21/23 )      Long-Term Goals: 6 Weeks      - Patient will demonstrate improved tolerance to STS transition ( PROGRESSING 11/21/23 )     - Patient will demonstrate improved functional ability via improved Patient Specific Functional Score to at least 8. ( NOT MET 11/21/23 )     - Patient will report sleep not disrupted by back pain to aid in participation of functional ADLs during the day. ( PROGRESSING 11/21/23 )     - Patient will report improved tolerance to functional activities without exacerbation of radicular symptoms ( NOT MET 11/21/23 )      Plan:  2 visits remain.        Total Session Time 40, Timed code minutes 40, and Untimed code minutes 0  THERAPEUTIC EXERCISE 40 minutes      Geni Search, PTA  11/23/2023, 08:09

## 2023-11-28 ENCOUNTER — Other Ambulatory Visit: Payer: Self-pay

## 2023-11-28 ENCOUNTER — Ambulatory Visit (HOSPITAL_COMMUNITY)
Admission: RE | Admit: 2023-11-28 | Discharge: 2023-11-28 | Disposition: A | Discharge status: Still In Hospital | Payer: Self-pay | Source: Ambulatory Visit

## 2023-11-28 NOTE — PT Treatment (Signed)
 [facility] Medicine [facility]  Outpatient Physical Therapy  [address]  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 Is cautious when lifting heavier objects, I make sure I keep whatever it is close to my body. No pain or limitations with bed mobility, however will occasionally have pain that will awaken him. STS transition has gotten better, and will feel a catch every now and then.      Objective: treatment delivered as noted below    Patient-Specific Functional Score:     Problem Score 6/12 7/8   1. Lifting heavy objects  5 5 5    2. Repetitive bending  5 7 10    Total 5 6 7.5   Total score = sum of the activity scores/number of activities    Minimal detectable change (90% CI) for avg score = 2 points    Minimal detectable change (90% CI) for single activity score = 3 points                 EXERCISE/ACTIVITY NAME REPETITIONS RESISTANCE COMPLETED THIS DOS   HEP Review  HEP Access Code: [email or web address]      Updated HEP 11/14/23   Gentle SAHD L      n                       Right lumbar side glide at wall 2x10   n   LTR   DKC with PB 10xea  x10  with PB  With PB n  n   Resisted LTR with PB X10 ea Blue tband n   Bridges   2x10    n   PPT   in seated    n   PPT with supine march   2x10  green  n   Clamshells    2x10   n   MFR: bilat QL and lumbar paraspinals  QL release  R Psoas release     n  n  Y  Y    Kinesio tape:  inhibit lumbar/thoracic paraspinal I strip x2       Hooklying UE pull down resisted with abdominal hold 2x10 Green Tband n   Quadruped: alternating LE and UE 10xea   n    nustep X10 min L4 y   Lumbar extension machine x30 40# y   Hip abd machine 2 x 20 50# y   DL press  2 x 10 899# y   Standing low rows  Standing rows x20 Green Tband  Green Tband n  n   Resisted amb in // bars: fwd/back/ bilat sidestepping X5 passes  [employer] tubing n   Updated HEP with performance     n   sciatic nerve glide 20x   n   Prone leg pull     y   Pelvic and sacral  assessment:   MET  Anterior sacral mob in prone    X2 cycles  X2 cycles      N  N   R Psoas and iliacus release     N   Heel lift to R shoe 2 ply   N    Objective Reassessment      Y         DISCONTINUED ACTIVITIES             prone press ups                                   Assessment: Cont to have shown improvements, and patient has become more aware of lifting and resting posture.      Short-Term Goals: 3 Weeks      - Patient will demonstrate improved lumbar AROM to at least 10 degrees ext.  ( MET 11/02/23 )    - Patient will be independent with progressive HEP to maximize gains from PT.  ( MET 11/02/23)     - Patient will report max 7 LBP to aid in participation in PT. MET 6/12    - Patient will report reduced radicular symptoms by 15% to aid in house ADLs. ( MET 11/21/23 )     - Patient will exhibit functional trunk mobility and strength to allow non-limiting bed mobility activities due to pain.  MET 7/8   Long-Term Goals: 6 Weeks      - Patient will demonstrate improved tolerance to STS transition ( PROGRESSING 11/28/23 )     - Patient will demonstrate improved functional ability via improved Patient Specific Functional Score to at least 8. Progressing 7/8    - Patient will report sleep not disrupted by back pain to aid in participation of functional ADLs during the day. ( PROGRESSING 11/28/23 )     - Patient will report improved tolerance to functional activities without  exacerbation of radicular symptoms ( NOT MET 11/21/23 )      Plan:  1 visits remaining.         Total Session Time 35, Timed code minutes 35, and Untimed code minutes 0  THERAPEUTIC EXERCISE 35 minutes      Geni Search, PTA  11/28/2023, 08:02

## 2023-11-30 ENCOUNTER — Ambulatory Visit
Admission: RE | Admit: 2023-11-30 | Discharge: 2023-11-30 | Disposition: A | Discharge status: Still In Hospital | Payer: Self-pay | Source: Ambulatory Visit | Attending: Family Medicine

## 2023-11-30 ENCOUNTER — Other Ambulatory Visit: Payer: Self-pay

## 2023-11-30 NOTE — PT Treatment (Signed)
 [facility] Medicine [facility]  Outpatient Physical Therapy  [address]  [place], [address]  [phone]  (Fax) [phone]    Physical Therapy Discharge Note    Date: 11/30/2023  Patient's Name: Travis Brooks  Date of Birth: January 01, 1956  Physical Therapy Discharge    Visit #/POC: 12/12  Authorization: med nec  POC Signed?: no  POC Ends: 12/05/23  Order Ends: open  Next Progress Note Due: visit 12   Evaluating Physical Therapist: Chauncey Kalata, PT, DPT  PT diagnosis/Reason for Referral: LBP  Next Scheduled Physician Appointment: 02/10/24,   Allergies/Contraindications: n/a        Subjective: Patient reports his usual central pain this morning. Patient reports this is not intense just enough to know it is there. Patient reports significant reduction in BLE symptoms. Patient continues to note his catch pain with rising from seated position. Patient reports this has not improved. Patient reports he is independent with HEP. Patient reports he does his exercises first thing in the morning. Patient express concern regarding catch pain. Patient reports improved tolerance to sleeping noting reduced frequency and intensity of leg symptoms. Patient reports 75% improvement since [facility].      Objective: treatment delivered as noted below     Patient-Specific Functional Score:  Problem Score IE  6/12 7/1 7/8   1. Lifting heavy objects  5 5 5 5    2. Repetitive bending  5 7 5 10    Total 5 6 5  7.5   Total score = sum of the activity scores/number of activities    Minimal detectable change (90% CI) for avg score = 2 points    Minimal detectable change (90% CI) for single activity score = 3 points              AROM    Right IE Right 6/12  Right 7/1 7/10 Left IE Left 6/12  Left 7/1 7/10   Flexion 70 75 72 80 NT NT NT NT   Extension 5 12 12 10  NT NT NT NT   Sidebend 23 30 20 19  32 32 25 20   Rotation NT NT NT NT NT NT NT NT   Comments: Patient notes increased central LBP with right radiation going into flexion and  returning, Patient notes right lateral pain with extension, patient reports central lumbar tightness with side bending bilaterally.      Strength (Manual Muscle Testing per [employer] Muscle Grading system)       Right IE  Right 7/1 Right 7/10 Left IE  Left 7/1  Left 7/10   Hip flexion (L1,2) 4+/5 4+/5 5/5 5/5 5/5 p! 5/5   Hip extension (L5,S1,2) NT NT  NT NT    Knee flexion (S1) 5/5 5/5 5/5 5/5 5/5 5/5   Knee extension (L2-4) 5/5 5/5 5/5 5/5 5/5 5/5   Ankle DF (L4-5) 5/5 5/5 5/5 5/5 5/5 5/5   Ankle PF (S1,2) 5/5 5/5 5/5 5/5 5/5 5/5   Strength comments:     Reflexes    IE  7/1 7/10   Patellar 1+ B 2+ B 1+B   Achilles 1+ B  2+ B  2+B   Clonus  NT   NT NT      Special Testing:   ( - ) SLUMP TESTING BLE   ( - ) SLR < 70 degrees BLE      EXERCISE/ACTIVITY NAME REPETITIONS RESISTANCE COMPLETED THIS DOS   HEP Review  HEP Access Code: [email or web address]  Updated HEP 11/14/23   Gentle SAHD L      n                       Right lumbar side glide at wall 2x10   n   LTR   DKC with PB 10xea  x10  with PB  With PB n  n   Resisted LTR with PB X10 ea Blue tband n   Bridges   2x10    n   PPT   in seated    n   PPT with supine march   2x10  green  n   Clamshells    2x10   n   MFR: bilat QL and lumbar paraspinals  QL release  R Psoas release     n  n  N  N   Kinesio tape: inhibit lumbar/thoracic paraspinal I strip x2      Hooklying UE pull down resisted with abdominal hold 2x10 Green Tband n   Quadruped: alternating LE and UE 10xea   n    nustep X10 min L6 y   Lumbar extension machine x30 40# y   Hip abd machine 2 x 20 50# y   DL press  2 x 10 899# y   Standing low rows  Standing rows x20 Green Tband  Green Tband n  n   Resisted amb in // bars: fwd/back/ bilat sidestepping X5 passes  [employer] tubing n   Updated HEP with performance     n   sciatic nerve glide 20x   n   Prone leg pull     y   Pelvic and sacral assessment:   MET  Anterior sacral mob in prone    X2 cycles  X2 cycles      N  N   R Psoas and iliacus release     N   Heel lift to R shoe 2  ply   N    Objective Reassessment      Y         DISCONTINUED ACTIVITIES             prone press ups                                   Assessment: Patient has made fair progress towards established short and long term PT goals. Patient continues to report a catch pain with sit to stand transition with a persistent aching pain in central low back. Patient does note significant reduction in both frequency and intensity of radicular symptoms. Patient demos good tolerance to lumbar flexion and rotation movements but remains apprehensive with lifting despite improved mechanics. Patient is independent with HEP and with transition to self management at this time.      Short-Term Goals: 3 Weeks      - Patient will demonstrate improved lumbar AROM to at least 10 degrees ext.  ( MET 11/02/23 )    - Patient will be independent with progressive HEP to maximize gains from PT.  ( MET 11/02/23)     - Patient will report max 7 LBP to aid in participation in PT. ( MET 11/02/23 )     - Patient will report reduced radicular symptoms by 15% to aid in house ADLs. ( MET 11/21/23 )     - Patient will exhibit functional trunk mobility and strength to allow  non-limiting bed mobility activities due to pain. ( MET 11/28/23 )      Long-Term Goals: 6 Weeks      - Patient will demonstrate improved tolerance to STS transition ( NOT MET 11/30/23 )     - Patient will demonstrate improved functional ability via improved Patient Specific Functional Score to at least 8. ( NOT MET 11/30/23 )     - Patient will report sleep not disrupted by back pain to aid in participation of functional ADLs during the day. ( PARTIALLY MET 11/30/23)     - Patient will report improved tolerance to functional activities without exacerbation of radicular symptoms ( NOT MET 11/30/23 )      Plan:  D/C to independent management    Total Session Time 41 and Timed code minutes 41  THERAPEUTIC EXERCISE 41 minutes    Chauncey Kalata, PT  11/30/2023, 07:55

## 2023-12-19 ENCOUNTER — Ambulatory Visit (INDEPENDENT_AMBULATORY_CARE_PROVIDER_SITE_OTHER): Payer: Self-pay | Admitting: Surgery

## 2023-12-19 ENCOUNTER — Encounter (INDEPENDENT_AMBULATORY_CARE_PROVIDER_SITE_OTHER): Payer: Self-pay | Admitting: Surgery

## 2023-12-19 ENCOUNTER — Other Ambulatory Visit: Payer: Self-pay

## 2023-12-19 VITALS — BP 127/75 | HR 81 | Temp 97.0°F | Resp 18 | Ht 68.0 in | Wt 172.6 lb

## 2023-12-19 DIAGNOSIS — R140 Abdominal distension (gaseous): Secondary | ICD-10-CM

## 2023-12-19 DIAGNOSIS — K582 Mixed irritable bowel syndrome: Secondary | ICD-10-CM

## 2023-12-19 MED ORDER — SIMETHICONE 80 MG CHEWABLE TABLET
80.0000 mg | CHEWABLE_TABLET | Freq: Four times a day (QID) | ORAL | 3 refills | Status: AC | PRN
Start: 2023-12-19 — End: ?

## 2023-12-19 MED ORDER — DICYCLOMINE 10 MG CAPSULE
10.0000 mg | ORAL_CAPSULE | Freq: Three times a day (TID) | ORAL | 3 refills | Status: DC | PRN
Start: 2023-12-19 — End: 2024-03-12

## 2023-12-19 NOTE — Progress Notes (Signed)
 GENERAL SURGERY, [facility]  [address]  [place] [address]    History and Physical    Name: Travis Brooks MRN:  [id number]   Date: 12/19/2023 DOB:  January 23, 1956 (67 y.o.)              Reason for Visit: Abdominal Pain (Abdominal pain, bloating,  feeling gassy )    History of Present Illness  Travis Brooks presents today for evaluation and management of increased abdominal bloating gaseous distention.  The patient has IBS mixed syndrome.  The patient states that has a lot of these problems began worsening since he has had back surgery and hip surgery.  The patient denies any nausea or vomiting.  He is eating well.    Review of the result(s) of each unique test:  Patient underwent diagnostic testing ( none) prior to this dates visit.  I have personally reviewed the results and that serves as a component of the medical decision making for this encounter       Review of prior external note(s) from each unique source:  Patients referral to this office including a recent assessment by the referring provider.  This was reviewed by me for this unique office visit for the indication and intent of the referral as well as any pertinent medical or surgical history relevant to the patients independent evaluation by me today.      Patient History  Past Medical History:   Diagnosis Date    Allergic rhinitis 20years ago    Cancer (CMS HCC)     SCCA    CPAP (continuous positive airway pressure) dependence     Diverticulitis     Esophageal reflux     controlled with medication    H/O hearing loss     bilat hearing aids    Hearing loss 9 years ago    Hyperlipidemia     controlled with medication    Hypertension     controlled with medication    Osteoarthritis     Sleep apnea     Wears glasses          Past Surgical History:   Procedure Laterality Date    COLON SURGERY      COLONOSCOPY      HX BACK SURGERY  2022    HX EYELID SURGERY Right 10/11/2022    RLL reconstruction after mohs    HX HERNIA  REPAIR  2016    x2    HX HIP REPLACEMENT Right 07/2020    LATERAL CANTHOPLASTY Right 09/01/2022         Current Outpatient Medications   Medication Sig    amLODIPine (NORVASC) 5 mg Oral Tablet Take 1 Tablet (5 mg total) by mouth Daily    aspirin  (ECOTRIN) 81 mg Oral Tablet, Delayed Release (E.C.) Take 1 Tablet (81 mg total) by mouth Once a day Takes in am    dicyclomine  (BENTYL ) 10 mg Oral Capsule Take 1 Capsule (10 mg total) by mouth Three times a day as needed    losartan (COZAAR) 50 mg Oral Tablet Take 1 Tablet (50 mg total) by mouth Once a day Takes in evening    magnesium oxide 400 mg Oral Tablet Take 1 Tablet (400 mg total) by mouth Daily    metoprolol  succinate (TOPROL -XL) 50 mg Oral Tablet Sustained Release 24 hr Take 1 Tablet (50 mg total) by mouth Once a day Takes in am    MV with Min-Lycopene-Lutein (CENTRUM SILVER) 0.4  mg-300 mcg- 250 mcg Oral Tablet Take 1 Tablet by mouth Once a day    potassium chloride  (K-TAB ) 20 mEq Oral Tablet Sustained Release TAKE 1 TABLET BY MOUTH EVERY DAY WITH FOOD    rosuvastatin (CRESTOR) 10 mg Oral Tablet Take 1 Tablet (10 mg total) by mouth Once a day Takes in evening    simethicone  (MYLICON) 80 mg Oral Tablet, Chewable Chew 1 Tablet (80 mg total) Every 6 hours as needed     Allergies[1]  Family Medical History:       Problem Relation (Age of Onset)    Hypertension (High Blood Pressure) Mother            Social History[2]         Physical Examination:  Vitals:    12/19/23 1512   BP: 127/75   Pulse: 81   Resp: 18   Temp: 36.1 C (97 F)   SpO2: 97%   Weight: 78.3 kg (172 lb 9.6 oz)   Height: 1.727 m (5' 8)   BMI: 26.24        General: appropriate for age. in no acute distress.    Vital signs are present above and have been reviewed by me     HEENT: Atraumatic, Normocephalic. PERRLA. EOMI. Nose clear. Throat clear    Lungs: Nonlabored breathing with symmetric expansion. Clear to auscultation bilaterally    Heart:Regular wth respect to rate and rythmn.    Abdomen:Soft.  Nontender. Nondistended and benign    Extremities: Grossly normal. No major deformities     Neuro:  Grossly normal motor and sensory function    Psychiatric: Alert and oriented to person, place, and time. affect appropriate      Assessment and Plan  The patient will be started on Mylicon for gas bloat and Bentyl  for the crampy abdominal discomfort.  He will continue to drink plenty of water  to stay hydrated take a laxative as needed if he is constipation.      I told the patient that a lot of his symptoms seem to be associated with the fact that he has had back surgery and hip surgery which can cause a lot of changes in the bowel habits.    The patient will call back in 4-6 weeks to let me know how he is doing.          Follow Up:  No follow-ups on file.      ICD-10-CM    1. Gaseous abdominal distention  R14.0       2. Irritable bowel syndrome with both constipation and diarrhea  K58.2           Travis Carberry B Ermine Stebbins, MD ,MBA,FACS    I appreciate the opportunity to be involved in the care of your patients.  If you have any questions or concerns regarding this encounter, please do not hesitate to contact me at your convenience.      This note may have been partially generated using [employer]ency Direct system, and there may be some incorrect words, spellings, and punctuation that were not noted in checking the note before saving, though effort was made to avoid such errors.               [1] No Known Allergies  [2]   Social History  Tobacco Use    Smoking status: Never    Smokeless tobacco: Never   Vaping Use    Vaping status: Never Used   Substance Use Topics  Alcohol  use: Never    Drug use: Never

## 2024-02-16 ENCOUNTER — Other Ambulatory Visit (HOSPITAL_COMMUNITY): Payer: Self-pay | Admitting: Family Medicine

## 2024-02-16 DIAGNOSIS — Z9889 Other specified postprocedural states: Secondary | ICD-10-CM

## 2024-03-12 ENCOUNTER — Other Ambulatory Visit: Payer: Self-pay

## 2024-03-12 ENCOUNTER — Ambulatory Visit (INDEPENDENT_AMBULATORY_CARE_PROVIDER_SITE_OTHER): Payer: Self-pay | Admitting: Surgery

## 2024-03-12 ENCOUNTER — Encounter (INDEPENDENT_AMBULATORY_CARE_PROVIDER_SITE_OTHER): Payer: Self-pay | Admitting: Surgery

## 2024-03-12 VITALS — BP 150/72 | HR 73 | Wt 172.0 lb

## 2024-03-12 DIAGNOSIS — R194 Change in bowel habit: Secondary | ICD-10-CM

## 2024-03-12 DIAGNOSIS — R1013 Epigastric pain: Secondary | ICD-10-CM

## 2024-03-12 DIAGNOSIS — Z01818 Encounter for other preprocedural examination: Secondary | ICD-10-CM

## 2024-03-12 MED ORDER — DICYCLOMINE 10 MG CAPSULE
10.0000 mg | ORAL_CAPSULE | Freq: Three times a day (TID) | ORAL | 3 refills | Status: AC | PRN
Start: 2024-03-12 — End: ?

## 2024-03-12 MED ORDER — SODIUM SUL 1.479 GRAM-POTAS CH 0.188 GRAM-MAGNES SUL 0.225 GRAM TABLET: ORAL_TABLET | ORAL | 0 refills | Status: DC

## 2024-03-13 ENCOUNTER — Encounter (INDEPENDENT_AMBULATORY_CARE_PROVIDER_SITE_OTHER): Payer: Self-pay | Admitting: Surgery

## 2024-03-13 NOTE — Progress Notes (Signed)
 GENERAL SURGERY, [facility] GENERAL SURGERY  [address]  [place] [address]    History and Physical    Name: Travis Brooks MRN:  [id number]   Date: 03/12/2024 DOB:  07/17/1955 (68 y.o.)              Reason for Visit: Abdominal Pain    History of Present Illness  Travis Brooks presents today for EGD and colonoscopy due to epigastric pain and changes in bowel habits. The patient states that his bloating has improved. The patient takes Bentyl .    Negative DM, blood thinners, family history of colon cancer      Review of the result(s) of each unique test:  Patient underwent diagnostic testing ( none ) prior to this dates visit.  I have personally reviewed the results and that serves as a component of the medical decision making for this encounter       Review of prior external note(s) from each unique source:  Patients referral to this office including a recent assessment by the referring provider.  This was reviewed by me for this unique office visit for the indication and intent of the referral as well as any pertinent medical or surgical history relevant to the patients independent evaluation by me today.      Patient History  Past Medical History:   Diagnosis Date    Allergic rhinitis 20years ago    Cancer (CMS HCC)     SCCA    CPAP (continuous positive airway pressure) dependence     Diverticulitis     Esophageal reflux     controlled with medication    H/O hearing loss     bilat hearing aids    Hearing loss 9 years ago    Hyperlipidemia     controlled with medication    Hypertension     controlled with medication    Osteoarthritis     Sleep apnea     Wears glasses          Past Surgical History:   Procedure Laterality Date    COLON SURGERY      COLONOSCOPY      HX BACK SURGERY  2022    HX EYELID SURGERY Right 10/11/2022    RLL reconstruction after mohs    HX HERNIA REPAIR  2016    x2    HX HIP REPLACEMENT Right 07/2020    LATERAL CANTHOPLASTY Right 09/01/2022         Current Outpatient  Medications   Medication Sig    amLODIPine (NORVASC) 5 mg Oral Tablet Take 1 Tablet (5 mg total) by mouth Daily    aspirin  (ECOTRIN) 81 mg Oral Tablet, Delayed Release (E.C.) Take 1 Tablet (81 mg total) by mouth Once a day Takes in am    dicyclomine  (BENTYL ) 10 mg Oral Capsule Take 1 Capsule (10 mg total) by mouth Three times a day as needed    losartan (COZAAR) 50 mg Oral Tablet Take 1 Tablet (50 mg total) by mouth Once a day Takes in evening    magnesium oxide 400 mg Oral Tablet Take 1 Tablet (400 mg total) by mouth Daily    metoprolol  succinate (TOPROL -XL) 50 mg Oral Tablet Sustained Release 24 hr Take 1 Tablet (50 mg total) by mouth Once a day Takes in am    MV with Min-Lycopene-Lutein (CENTRUM SILVER) 0.4 mg-300 mcg- 250 mcg Oral Tablet Take 1 Tablet by mouth Once a day    potassium chloride  (  K-TAB ) 20 mEq Oral Tablet Sustained Release TAKE 1 TABLET BY MOUTH EVERY DAY WITH FOOD    rosuvastatin (CRESTOR) 10 mg Oral Tablet Take 1 Tablet (10 mg total) by mouth Once a day Takes in evening    simethicone  (MYLICON) 80 mg Oral Tablet, Chewable Chew 1 Tablet (80 mg total) Every 6 hours as needed    sod sulf-pot chloride-mag sulf 1.479-0.188- 0.225 gram Oral Tablet Day before Procedure Take 12 pills between 10:00 am to 10:30 am Take 12 pills between 6:00 pm to 6:30 pm Drink each with 32 oz water .     Allergies[1]  Family Medical History:       Problem Relation (Age of Onset)    Hypertension (High Blood Pressure) Mother            Social History[2]         Physical Examination:  Vitals:    03/12/24 1500   BP: (!) 150/72   Pulse: 73   SpO2: 97%   Weight: 78 kg (172 lb)        General: appropriate for age. in no acute distress.    Vital signs are present above and have been reviewed by me     HEENT: Atraumatic, Normocephalic. PERRLA. EOMI. Nose clear. Throat clear    Lungs: Nonlabored breathing with symmetric expansion. Clear to auscultation bilaterally    Heart:Regular wth respect to rate and rythmn.    Abdomen:Soft.  Slightly tender to deep palpation in the epigastrium. Nondistended and benign otherwise    Extremities: Grossly normal. No major deformities     Neuro:  Grossly normal motor and sensory function    Psychiatric: Alert and oriented to person, place, and time. affect appropriate      Assessment and Plan  EGD and colonoscopy scheduled for 05/30/23 @ 700am    Discussed indications, risks and benefits of esophagogastroduodenoscopy and colonoscopy with the patient.  Discussed the possibility of polypectomy, biopsies, and possible repeat examinations.  Risks include bleeding, sedation risks, possibility of missed diagnosis of polyp or malignancy, and remote possibilities of perforation and death.  All questions were answered, and informed consent was clearly obtained.      Follow Up:  No follow-ups on file.      ICD-10-CM    1. Epigastric pain  R10.13       2. Change in bowel habits  R19.4           Travis Rinks B Hiroki Wint, MD ,MBA,FACS    I appreciate the opportunity to be involved in the care of your patients.  If you have any questions or concerns regarding this encounter, please do not hesitate to contact me at your convenience.      This note may have been partially generated using MModal Fluency Direct system, and there may be some incorrect words, spellings, and punctuation that were not noted in checking the note before saving, though effort was made to avoid such errors.               [1] No Known Allergies  [2]   Social History  Tobacco Use    Smoking status: Never    Smokeless tobacco: Never   Vaping Use    Vaping status: Never Used   Substance Use Topics    Alcohol  use: Never    Drug use: Never

## 2024-03-18 ENCOUNTER — Ambulatory Visit
Admission: RE | Admit: 2024-03-18 | Discharge: 2024-03-18 | Disposition: A | Discharge status: Home / Self Care | Source: Ambulatory Visit | Attending: Family Medicine

## 2024-03-18 ENCOUNTER — Other Ambulatory Visit: Payer: Self-pay

## 2024-03-18 DIAGNOSIS — Z9889 Other specified postprocedural states: Secondary | ICD-10-CM | POA: Insufficient documentation

## 2024-03-18 DIAGNOSIS — M51369 Other intervertebral disc degeneration, lumbar region without mention of lumbar back pain or lower extremity pain: Secondary | ICD-10-CM

## 2024-05-29 ENCOUNTER — Ambulatory Visit
Admission: RE | Admit: 2024-05-29 | Discharge: 2024-05-29 | Disposition: A | Discharge status: Home / Self Care | Source: Ambulatory Visit | Attending: Surgery | Admitting: Surgery

## 2024-05-29 ENCOUNTER — Other Ambulatory Visit: Payer: Self-pay

## 2024-05-29 ENCOUNTER — Ambulatory Visit (HOSPITAL_COMMUNITY): Admitting: Surgery

## 2024-05-29 ENCOUNTER — Ambulatory Visit (HOSPITAL_COMMUNITY)

## 2024-05-29 ENCOUNTER — Encounter (HOSPITAL_COMMUNITY)
Admission: RE | Disposition: A | Discharge status: Home / Self Care | Payer: Self-pay | Source: Ambulatory Visit | Attending: Surgery

## 2024-05-29 ENCOUNTER — Encounter (HOSPITAL_COMMUNITY): Payer: Self-pay | Admitting: Surgery

## 2024-05-29 DIAGNOSIS — K2950 Unspecified chronic gastritis without bleeding: Secondary | ICD-10-CM | POA: Insufficient documentation

## 2024-05-29 DIAGNOSIS — R1013 Epigastric pain: Secondary | ICD-10-CM | POA: Insufficient documentation

## 2024-05-29 DIAGNOSIS — R194 Change in bowel habit: Secondary | ICD-10-CM | POA: Insufficient documentation

## 2024-05-29 DIAGNOSIS — K641 Second degree hemorrhoids: Secondary | ICD-10-CM | POA: Insufficient documentation

## 2024-05-29 DIAGNOSIS — K5730 Diverticulosis of large intestine without perforation or abscess without bleeding: Secondary | ICD-10-CM | POA: Insufficient documentation

## 2024-05-29 MED ORDER — PROPOFOL 10 MG/ML INTRAVENOUS EMULSION
INTRAVENOUS | Status: AC
  Filled 2024-05-29: qty 20

## 2024-05-29 MED ORDER — SUCRALFATE 1 GRAM TABLET: 1.0000 g | ORAL_TABLET | Freq: Two times a day (BID) | ORAL | 3 refills | Status: AC

## 2024-05-29 MED ORDER — PROPOFOL 10 MG/ML INTRAVENOUS EMULSION
INTRAVENOUS | Status: DC | PRN
  Administered 2024-05-29: 180 ug/kg/min via INTRAVENOUS
  Administered 2024-05-29: 0 ug/kg/min via INTRAVENOUS
  Administered 2024-05-29: 140 ug/kg/min via INTRAVENOUS

## 2024-05-29 MED ORDER — PROPOFOL 10 MG/ML IV BOLUS
INJECTION | Freq: Once | INTRAVENOUS | Status: DC | PRN
  Administered 2024-05-29: 25 mg via INTRAVENOUS
  Administered 2024-05-29: 100 mg via INTRAVENOUS
  Administered 2024-05-29: 25 mg via INTRAVENOUS

## 2024-05-29 MED ORDER — PANTOPRAZOLE 40 MG TABLET,DELAYED RELEASE: 40.0000 mg | DELAYED_RELEASE_TABLET | Freq: Every day | ORAL | 3 refills | Status: AC

## 2024-05-29 MED ORDER — PHENYLEPHRINE 10 MG/ML INJECTION SOLUTION
INTRAMUSCULAR | Status: AC
  Filled 2024-05-29: qty 1

## 2024-05-29 MED ORDER — LACTATED RINGERS INTRAVENOUS SOLUTION: INTRAVENOUS | Status: DC | PRN

## 2024-05-29 MED ORDER — PHENYLEPHRINE 1 MG/10 ML (100 MCG/ML) IN 0.9 % SOD.CHLORIDE IV SYRINGE
INJECTION | INTRAVENOUS | Status: AC
  Filled 2024-05-29: qty 10

## 2024-05-29 NOTE — H&P (Addendum)
 [facility]  General Surgery  History and Physical    Date of Service:  05/29/2024  Jerrion, Tabbert, 69 y.o. male  Date of Admission:  05/29/2024  Date of Birth:  [birth year]  PCP: Garnette Pinal, MD    Reason for admission:  EGD and colonoscopy    HPI:  DEUNTAE KOCSIS is a 69 y.o. White male who is admitted for Epigastric pain [R10.13]  Change in bowel habits [R19.4]     Mr. Gunnoe presents today for EGD and colonoscopy due to epigastric pain and changes in bowel habits. The patient states that his bloating has improved. The patient takes Bentyl .     Negative DM, blood thinners, family history of colon cancer        Review of the result(s) of each unique test:  Patient underwent diagnostic testing ( none ) prior to this dates visit.  I have personally reviewed the results and that serves as a component of the medical decision making for this encounter        Review of prior external note(s) from each unique source:  Patients referral to this office including a recent assessment by the referring provider.  This was reviewed by me for this unique office visit for the indication and intent of the referral as well as any pertinent medical or surgical history relevant to the patients independent evaluation by me today.    Past Medical History:   Diagnosis Date    Allergic rhinitis 20years ago    Cancer (CMS HCC)     SCCA    CPAP (continuous positive airway pressure) dependence     Diverticulitis     Esophageal reflux     controlled with medication    H/O hearing loss     bilat hearing aids    Hearing loss 9 years ago    Hyperlipidemia     controlled with medication    Hypertension     controlled with medication    Osteoarthritis     Sleep apnea     Wears glasses       Past Surgical History:   Procedure Laterality Date    COLON SURGERY      COLONOSCOPY      HX BACK SURGERY  2022    HX EYELID SURGERY Right 10/11/2022    RLL reconstruction after mohs    HX HERNIA REPAIR  2016    x2    HX HIP  REPLACEMENT Right 07/2020    LATERAL CANTHOPLASTY Right 09/01/2022      Social History[1]    Family Medical History:       Problem Relation (Age of Onset)    Hypertension (High Blood Pressure) Mother           Medications Prior to Admission       Prescriptions    amLODIPine (NORVASC) 5 mg Oral Tablet    Take 1 Tablet (5 mg total) by mouth Daily    aspirin  (ECOTRIN) 81 mg Oral Tablet, Delayed Release (E.C.)    Take 1 Tablet (81 mg total) by mouth Daily Takes in am    dicyclomine  (BENTYL ) 10 mg Oral Capsule    Take 1 Capsule (10 mg total) by mouth Three times a day as needed    losartan (COZAAR) 50 mg Oral Tablet    Take 1 Tablet (50 mg total) by mouth Once a day Takes in evening    magnesium oxide 400 mg Oral Tablet    Take 1  Tablet (400 mg total) by mouth Daily    metoprolol  succinate (TOPROL -XL) 50 mg Oral Tablet Sustained Release 24 hr    Take 1 Tablet (50 mg total) by mouth Daily Takes in am    MV with Min-Lycopene-Lutein (CENTRUM SILVER) 0.4 mg-300 mcg- 250 mcg Oral Tablet    Take 1 Tablet by mouth Once a day    potassium chloride  (K-TAB ) 20 mEq Oral Tablet Sustained Release    TAKE 1 TABLET BY MOUTH EVERY DAY WITH FOOD    rosuvastatin (CRESTOR) 10 mg Oral Tablet    Take 1 Tablet (10 mg total) by mouth Once a day Takes in evening    simethicone  (MYLICON) 80 mg Oral Tablet, Chewable    Chew 1 Tablet (80 mg total) Every 6 hours as needed           Allergies[2]       Patient Vitals for the past 24 hrs:   BP Temp Pulse Resp SpO2 Height Weight   05/29/24 0658 (!) 158/88 36.9 C (98.5 F) 69 15 98 % 1.727 m (5' 8) 77.1 kg (170 lb)          General: appropriate for age. in no acute distress.    Vital signs are present above and have been reviewed by me     HEENT: Atraumatic, Normocephalic. PERRLA, EOMI. Nose clear. Throat clear.    Lungs: Nonlabored breathing with symmetric expansion.  Clear to auscultation bilaterally    Heart:Regular wth respect to rate and rythmn.    Abdomen:Soft. Slightly tender to deep palpation  in the epigastrium. Nondistended and benign otherwise     Extremities:  Grossly normal with good range of motion and no major deformities.    Neuro:  Grossly normal motor and sensory function. CN's II through XII intact.    Psychiatric: Alert and oriented to person, place, and time. affect appropriate    Laboratory Data:     No results found for any visits on 05/29/24 (from the past 24 hours).    Imaging Studies:    No orders to display        Assessment/Plan:  Epigastric pain [R10.13]  Change in bowel habits [R19.4]    EGD and colonoscopy scheduled for 05/29/2024 @ 700am     Discussed indications, risks and benefits of esophagogastroduodenoscopy and colonoscopy with the patient.  Discussed the possibility of polypectomy, biopsies, and possible repeat examinations.  Risks include bleeding, sedation risks, possibility of missed diagnosis of polyp or malignancy, and remote possibilities of perforation and death.  All questions were answered, and informed consent was clearly obtained.    This note was partially created using voice recognition software and is inherently subject to errors including those of syntax and sound alike  substitutions which may escape proof reading. In such instances, original meaning may be extrapolated by contextual derivation.    Lafawn Lenoir B Charle Mclaurin, MD, MBA, FACS         [1]   Social History  Tobacco Use    Smoking status: Never    Smokeless tobacco: Never   Vaping Use    Vaping status: Never Used   Substance Use Topics    Alcohol  use: Never    Drug use: Never   [2] No Known Allergies

## 2024-05-29 NOTE — Anesthesia Preprocedure Evaluation (Signed)
 ANESTHESIA PRE-OP EVALUATION  Planned Procedure: EGD  COLONOSCOPY  Review of Systems     anesthesia history negative     patient summary reviewed          Pulmonary   Noncompliant with CPAP and sleep apnea,   Cardiovascular    Hypertension, ECG reviewed and hyperlipidemia ,No peripheral edema,  Exercise Tolerance: > or = 4 METS        GI/Hepatic/Renal    GERD        Endo/Other    osteoarthritis and diverticulitis,      Neuro/Psych/MS   negative neuro/psych ROS,      Cancer  CA,                     Physical Assessment      Airway       Mallampati: II    TM distance: 3 FB    Neck ROM: full  Mouth Opening: good.  Facial hair  Beard  No endotracheal tube present  No Tracheostomy present    Dental       Dentition intact             Pulmonary    Comment: Noncompliant with CPAP  Breath sounds clear to auscultation  (-) no rhonchi, no decreased breath sounds, no wheezes, no rales and no stridor     Cardiovascular    Rhythm: regular  Rate: Normal  (-) no friction rub, carotid bruit is not present, no peripheral edema and no murmur     Other findings            Plan  ASA 2     Planned anesthesia type: general     total intravenous anesthesia            SLEEP APNEA  Patient is at risk of obstructive sleep apnea and Education provided regarding risk of obstructive sleep apnea        Intravenous induction       Anesthetic plan and risks discussed with patient  signed consent obtained          Patient's NPO status is appropriate for Anesthesia.           Plan discussed with CRNA.

## 2024-05-29 NOTE — Discharge Instructions (Signed)
 SURGICAL DISCHARGE INSTRUCTIONS     Dr. Marlyce, Gene B, MD  performed your EGD WITH BIOPSY, COLONOSCOPY today at the [facility]    [facility]:  Monday through Friday from 8 a.m. - 4 p.m.: [phone]    For T&D: [phone]  Between 4 p.m. - 8 a.m., weekends and holidays:  Call ER [phone]    PLEASE SEE WRITTEN HANDOUTS AS DISCUSSED BY YOUR NURSE:  Charlene    SIGNS AND SYMPTOMS OF A WOUND / INCISION INFECTION   Be sure to watch for the following:  Increase in redness or red streaks near or around the wound or incision.  Increase in pain that is intense or severe and cannot be relieved by the pain medication that your doctor has given you.  Increase in swelling that cannot be relieved by elevation of a body part, or by applying ice, if permitted.  Increase in drainage, or if yellow / green in color and smells bad. This could be on a dressing or a cast.  Increase in fever for longer than 24 hours, or an increase that is higher than 101 degrees Fahrenheit (normal body temperature is 98 degrees Fahrenheit). The incision may feel warm to the touch.    **CALL YOUR DOCTOR IF ONE OR MORE OF THESE SIGNS / SYMPTOMS SHOULD OCCUR.    ANESTHESIA INFORMATION   ANESTHESIA -- ADULT PATIENTS:  You have received intravenous sedation / general anesthesia, and you may feel drowsy and light-headed for several hours. You may even experience some forgetfulness of the procedure. DO NOT DRIVE A MOTOR VEHICLE or perform any activity requiring complete alertness or coordination until you feel fully awake in about 24-48 hours. Do not drink alcoholic beverages for at least 24 hours. Do not stay alone, you must have a responsible adult available to be with you. You may also experience a dry mouth or nausea for 24 hours. This is a normal side effect and will disappear as the effects of the medication wear off.    REMEMBER   If you experience any difficulty breathing, chest pain, bleeding that  you feel is excessive, persistent nausea or vomiting or for any other concerns:  Call your physician Dr.  Marlyce, Amaryllis NOVAK, MD   at [phone] . You may also ask to have the general doctor on call paged. They are available to you 24 hours a day.      SPECIAL INSTRUCTIONS / COMMENTS   Today's Results: severe gastritis**Protonix  and Carafate  prescription sent to pharmacy-take as directed**                               Normal anastamosis, scattered diverticulosis, grade 2 hemorrhoids    FOLLOW-UP APPOINTMENTS   Follow up with Dr Lynnette as needed.  Repeat colonoscopy in 10 years. Put on list in office-they will call when time to schedule.  {PRN Surgical Providers:[id number]}

## 2024-05-29 NOTE — OR Surgeon (Signed)
 [facility]      Patient Name: Travis Brooks, Travis Brooks Number: [id number]  Date of Service: 05/29/2024   Date of Birth: 09-29-1955      Pre-Operative Diagnosis: Epigastric pain [R10.13]  Change in bowel habits [R19.4]     Post-Operative Diagnosis: severe gastritis    normal anastamosis  scattered diverticulosis  grade 2 hemorrhoids    Procedure(s)/Description:  EGD WITH BIOPSY: 43239 (CPT)    COLONOSCOPY: 54621 (CPT)       Attending Surgeon: Amaryllis KATHEE Denise, MD     Anesthesia:  CRNA: Cleotilde Annabella PARAS, APRN,CRNA    Anesthesia Type: .General     Specimens Removed:   ID Type Source Tests Collected by Time Destination   1 : bx Tissue Antrum SURGICAL PATHOLOGY SPECIMEN Jin Capote, Amaryllis KATHEE, MD 05/29/2024 [id number]      Order Name Source Comment Collection Info Order Time   SURGICAL PATHOLOGY SPECIMEN Antrum Pre-op diagnosis:  Epigastric pain [R10.13]  Change in bowel habits [R19.4] Collected By: Denise Amaryllis KATHEE, MD 05/29/2024  7:36 AM     Release to patient   Manual release only          Reason for preventing immediate release   Privacy concern (Manual Release Only)            The patient indicates that they have read and understood the preoperative EGD with biopsy and colonoscopy consent form. The benefits, risks and alternatives to the procedure were discussed. I specifically discussed the risk of bleeding and/or perforation requiring operation.The patient indicates they have no further question and wish to proceed. Informed consent was obtained from the patient and/or medical power of attorney.  The patient was brought in to the endoscopy suite and placed on the stretcher in the left lateral decubitus position. The video gastroscope was then inserted into the mouth, down the esophagus and into the stomach after adequate IV and topical anesthetic was provided. The stomach was insufflated with air and examination of the stomach was performed. Antral biopsy was obtained and sent to the Pathology department for  microscopic examination. Hemostasis was well obtained.  The scope was advanced to the pyloric channel.  The pylorus was cannulated and the scope was advanced into the duodenum without any difficulty. Examination of the first and second portions of the duodenum was performed and the findings were noted as above. The scope was withdrawn back into the stomach in which an extensive examination was performed with the above noted findings. Retroflexion of the scope was performed which gave good visualization of the proximal stomach and GE junction from below. The scope was pulled back up into the proximal stomach and GE junction, with the above noted findings.  The scope was withdrawn back up into the esophagus and out the mouth without any difficulty. The patient tolerated the procedure well. No complications were encountered.   There were no unplanned events.  EKG, pulse, pulse oximetry and blood pressure were monitored throughout the entire procedure.  The patient was positioned on the stretcher in the left lateral decubitus position. After IV sedation was given, full finger digital rectal examination was performed with a circumferential sweep of the distal rectal mucosa. Subsequently, the flexible colonoscope was inserted into the rectum and passed without any difficulty. The colonoscope was then advanced up into the sigmoid colon anastomotic area, descending colon, transverse colon, right colon and cecum without any difficulty. Gross examination of each section of the colon was performed. Cecal intubation was achieved and  the appendiceal orifice and ileocecal valve were identified. The operative findings were noted as described above. The colonoscope was withdrawn carefully examining the mucosa as the scope was being extracted with particular attention paid to the proximal sides of folds, flexures, bends and rectal valves. At approximately 10 cm. from the anal verge, the colonoscope was retroflexed to fully examine  the distal rectum. The colonoscope was removed and a repeat digital rectal examination was performed at the completion of the procedure. The patient tolerated the procedure well. No intraoperative complications were encountered.  EKG, pulse, pulse oximetry and blood pressure were monitored throughout the entire procedure.  There were no unplanned events.    The patient was instructed to contact me if they have any problems with their stomach and/or colon such as nausea, vomiting, bleeding, pain or changes in bowel habits. They understood and agreed to do so.    The patient will be given a prescription for Protonix  and Carafate   Aleeah Greeno B. Demir Titsworth, MD, MBA, FACS  [facility] -General Surgery

## 2024-05-29 NOTE — Anesthesia Postprocedure Evaluation (Signed)
 Anesthesia Post Op Evaluation    Patient: Travis Brooks  Procedure(s):  EGD WITH BIOPSY  COLONOSCOPY    Last Vitals:Temperature: 36.6 C (97.9 F) (05/29/24 0748)  Heart Rate: 72 (05/29/24 0748)  BP (Non-Invasive): 103/67 (05/29/24 0748)  Respiratory Rate: 17 (05/29/24 0748)  SpO2: 98 % (05/29/24 0748)    No notable events documented.    Patient is sufficiently recovered from the effects of anesthesia to participate in the evaluation and has returned to their pre-procedure level.  Patient location during evaluation: PACU       Patient participation: complete - patient cannot participate  Level of consciousness: responsive to light touch    Pain score: 0  Pain management: adequate  Airway patency: patent    Anesthetic complications: no  Cardiovascular status: acceptable  Respiratory status: acceptable  Hydration status: acceptable  Patient post-procedure temperature: Pt Normothermic   PONV Status: Absent

## 2024-05-30 ENCOUNTER — Telehealth (INDEPENDENT_AMBULATORY_CARE_PROVIDER_SITE_OTHER): Payer: Self-pay | Admitting: Surgery

## 2024-05-30 DIAGNOSIS — K2950 Unspecified chronic gastritis without bleeding: Secondary | ICD-10-CM

## 2024-05-30 LAB — SURGICAL PATHOLOGY SPECIMEN

## 2024-07-26 ENCOUNTER — Ambulatory Visit (INDEPENDENT_AMBULATORY_CARE_PROVIDER_SITE_OTHER): Payer: Self-pay | Admitting: Otolaryngology
